# Patient Record
Sex: Female | Born: 1945 | Race: White | Hispanic: No | Marital: Married | Smoking: Never smoker
Health system: Southern US, Community
[De-identification: ages and names within clinical notes are randomized; demographics above are authoritative.]

## PROBLEM LIST (undated history)

## (undated) DIAGNOSIS — E039 Hypothyroidism, unspecified: Secondary | ICD-10-CM

## (undated) DIAGNOSIS — M797 Fibromyalgia: Secondary | ICD-10-CM

## (undated) DIAGNOSIS — G629 Polyneuropathy, unspecified: Secondary | ICD-10-CM

## (undated) DIAGNOSIS — I10 Essential (primary) hypertension: Secondary | ICD-10-CM

## (undated) DIAGNOSIS — K5190 Ulcerative colitis, unspecified, without complications: Secondary | ICD-10-CM

## (undated) HISTORY — PX: ABDOMINAL HYSTERECTOMY: SHX81

## (undated) HISTORY — PX: CHOLECYSTECTOMY: SHX55

## (undated) HISTORY — DX: Polyneuropathy, unspecified: G62.9

## (undated) HISTORY — DX: Hypothyroidism, unspecified: E03.9

## (undated) HISTORY — PX: TONSILLECTOMY: SUR1361

## (undated) HISTORY — PX: BACK SURGERY: SHX140

## (undated) HISTORY — PX: APPENDECTOMY: SHX54

---

## 2002-02-24 HISTORY — PX: COLONOSCOPY: SHX174

## 2006-08-25 HISTORY — PX: COLONOSCOPY: SHX174

## 2012-02-25 DIAGNOSIS — G629 Polyneuropathy, unspecified: Secondary | ICD-10-CM

## 2012-02-25 HISTORY — DX: Polyneuropathy, unspecified: G62.9

## 2013-02-24 HISTORY — PX: ESOPHAGOGASTRODUODENOSCOPY: SHX1529

## 2013-04-24 HISTORY — PX: FLEXIBLE SIGMOIDOSCOPY: SHX1649

## 2014-06-02 ENCOUNTER — Encounter: Payer: Self-pay | Admitting: Gastroenterology

## 2014-06-21 ENCOUNTER — Encounter: Payer: Self-pay | Admitting: Gastroenterology

## 2014-06-21 ENCOUNTER — Ambulatory Visit (INDEPENDENT_AMBULATORY_CARE_PROVIDER_SITE_OTHER): Payer: Medicare Other | Admitting: Gastroenterology

## 2014-06-21 VITALS — BP 148/85 | HR 63 | Temp 97.0°F | Ht 63.0 in | Wt 195.4 lb

## 2014-06-21 DIAGNOSIS — R197 Diarrhea, unspecified: Secondary | ICD-10-CM

## 2014-06-21 DIAGNOSIS — R112 Nausea with vomiting, unspecified: Secondary | ICD-10-CM | POA: Insufficient documentation

## 2014-06-21 DIAGNOSIS — R1319 Other dysphagia: Secondary | ICD-10-CM | POA: Insufficient documentation

## 2014-06-21 DIAGNOSIS — R945 Abnormal results of liver function studies: Secondary | ICD-10-CM

## 2014-06-21 DIAGNOSIS — R1032 Left lower quadrant pain: Secondary | ICD-10-CM | POA: Diagnosis not present

## 2014-06-21 DIAGNOSIS — R748 Abnormal levels of other serum enzymes: Secondary | ICD-10-CM

## 2014-06-21 DIAGNOSIS — R1310 Dysphagia, unspecified: Secondary | ICD-10-CM | POA: Insufficient documentation

## 2014-06-21 DIAGNOSIS — R1314 Dysphagia, pharyngoesophageal phase: Secondary | ICD-10-CM | POA: Diagnosis not present

## 2014-06-21 DIAGNOSIS — R7989 Other specified abnormal findings of blood chemistry: Secondary | ICD-10-CM

## 2014-06-21 NOTE — Progress Notes (Signed)
Primary Care Physician:  Terance HartPowell, Brook R, PA-C  Primary Gastroenterologist:  Jonette EvaSandi Fields, MD   Chief Complaint  Patient presents with  . Gastrophageal Reflux  . Diarrhea  . Abdominal Pain    HPI:  Bonnie Reynolds is a 69 y.o. female here at the request of her PCP for further evaluation of chronic abdominal pain, diarrhea, GERD.   Patient reports being evaluated multiple times by Dr. Nolen MuPanya and Dr. Virgilio BellingShifflett over the years. States she is not aware of any specific diagnoses to explain her symptoms. Believe she was checked for celiac disease. States she thinks she was on Crohn's medication previously. Reports having a sigmoidoscopy about a year ago by Dr. Samuella CotaPandya.  GERD  Reports chronic GERD for years. Initially presented with nausea vomiting felt to be GERD related. Complains of excessive belching. Only occasional heartburn. Avoids dairy products, soft drinks. She does consume tea with caffeine. Intermittent vomiting. Associated with dysphagia to solid foods. Has to wash her food down. No previous esophageal stretching to her knowledge. States in the past she tried a PPI years ago but she stopped it because it didn't seem to help any of her symptoms. Complains of nausea after just a few bites of food. Denies any weight loss however. Symptoms improved with a couple of ounces of wine per night. Used to take Aleve frequently for neuropathy but quit about a year ago. Only takes occasional ibuprofen for headache. No aspirin powders.  Recently try pantoprazole 20 mg daily but no improvement. No longer taking.  Abdominal pain Complains of lower abdominal cramping especially worse at nighttime. Not associated with a bowel movement. Occurring for years. Couple of ounces of wine seems to make it better. Does not appear to be associated with food. Mild to moderate severity.  Alternating bowel movements  May go a few days without a bowel movement, then pass hard stool followed by diarrhea. Then passes mucus  for a few days, malodorous. Some fecal incontinence with loose stools at times. Constipation and diarrhea about half-and-half. Utilizes "diarrhea pill" which then exacerbates constipation. Recently was given samples of Viberzi, took one and developed uncontrollable shaking and crying. BM may skip 2-3 days without but then pass hard ball but then to diarrhea. Then mucous for few days. Fish odor. Incontinence with it. Denies any melena. No recent rectal bleeding.    Current Outpatient Prescriptions  Medication Sig Dispense Refill  . levothyroxine (SYNTHROID, LEVOTHROID) 125 MCG tablet Take 125 mcg by mouth daily before breakfast.    . VITAMIN D, CHOLECALCIFEROL, PO Take by mouth daily.     No current facility-administered medications for this visit.    Allergies as of 06/21/2014 - Review Complete 06/21/2014  Allergen Reaction Noted  . Codeine Other (See Comments) 06/21/2014  . Prednisone Swelling 06/21/2014    Past Medical History  Diagnosis Date  . Hypothyroidism   . Neuropathy     Past Surgical History  Procedure Laterality Date  . Abdominal hysterectomy      cancer  . Cholecystectomy    . Appendectomy    . Tonsillectomy    . Back surgery      X 2, cervical and thoracic    Family History  Problem Relation Age of Onset  . Colitis Brother     1/2 brother  . Colon cancer Neg Hx   . Neuropathy Mother     deceased age 71  . CVA Father     deceased age 594    History   Social  History  . Marital Status: Unknown    Spouse Name: N/A  . Number of Children: 2  . Years of Education: N/A   Occupational History  . hair dresser    Social History Main Topics  . Smoking status: Never Smoker   . Smokeless tobacco: Not on file  . Alcohol Use: Not on file  . Drug Use: Not on file  . Sexual Activity: Not on file   Other Topics Concern  . Not on file   Social History Narrative  . No narrative on file      ROS:  General: Negative for anorexia, weight loss, fever,  chills, fatigue, weakness. Eyes: Negative for vision changes.  ENT: Negative for hoarseness, nasal congestion. CV: Negative for chest pain, angina, palpitations, dyspnea on exertion, peripheral edema.  Respiratory: Negative for dyspnea at rest, dyspnea on exertion, cough, sputum, wheezing. Episode of inability to move air, requiring ER visit with no subsequent findings. Felt like she was choking. Not associated with eating. GI: See history of present illness. GU:  Negative for dysuria, hematuria, urinary incontinence, urinary frequency, nocturnal urination.  MS: Negative for joint pain. Positive back pain.  Derm: Negative for rash or itching.  Neuro: Negative for weakness, abnormal sensation, seizure, frequent headaches, memory loss, confusion.  Psych: Negative for anxiety, depression, suicidal ideation, hallucinations.  Endo: Negative for unusual weight change.  Heme: Negative for bruising or bleeding. Allergy: Negative for rash or hives.    Physical Examination:  BP 148/85 mmHg  Pulse 63  Temp(Src) 97 F (36.1 C)  Ht  (1.6 m)  Wt 195 lb 6.4 oz (88.633 kg)  BMI 34.62 kg/m2   General: Well-nourished, well-developed in no acute distress.  Head: Normocephalic, atraumatic.   Eyes: Conjunctiva pink, no icterus. Mouth: Oropharyngeal mucosa moist and pink , no lesions erythema or exudate. Neck: Supple without thyromegaly, masses, or lymphadenopathy.  Lungs: Clear to auscultation bilaterally.  Heart: Regular rate and rhythm, no murmurs rubs or gallops.  Abdomen: Bowel sounds are normal, nontender, nondistended, no hepatosplenomegaly or masses, no abdominal bruits or    hernia , no rebound or guarding.   Rectal: Not performed Extremities: No lower extremity edema. No clubbing or deformities.  Neuro: Alert and oriented x 4 , grossly normal neurologically.  Skin: Warm and dry, no rash or jaundice.   Psych: Alert and cooperative, normal mood and affect.  Labs: Labs from December  2015, PCP  White blood cell count 4500, hemoglobin 11.5 normal, platelets 211,000, creatinine 0.75, calcium 8.9, albumin 4.1, total bilirubin 0.5, alkaline phosphatase 154 high, AST 38, ALT 36 high, TSH 0.429 low  Imaging Studies: No results found.

## 2014-06-21 NOTE — Patient Instructions (Signed)
1. Give me a couple of days to request and review your records. Further recommendations to follow.

## 2014-06-23 DIAGNOSIS — R7989 Other specified abnormal findings of blood chemistry: Secondary | ICD-10-CM | POA: Insufficient documentation

## 2014-06-23 DIAGNOSIS — R945 Abnormal results of liver function studies: Secondary | ICD-10-CM | POA: Insufficient documentation

## 2014-06-23 MED ORDER — DEXLANSOPRAZOLE 60 MG PO CPDR: 60.0000 mg | DELAYED_RELEASE_CAPSULE | Freq: Every day | ORAL | Status: DC

## 2014-06-23 NOTE — Progress Notes (Signed)
Reviewed outside medical records.  Colonoscopy, 09/15/07 by Dr. Dessie Comaoug Shiflett. Normal colon except for diverticulosis, 2 minute rectal polyps biopsied. Pathology revealed adenomatous and hyperplastic.  Colonoscopy, 02/2002 by Dr. Dessie Comaoug Shiflett.  Unremarkable.  EGD, 02/2013 by Dr. Samuella CotaPandya. Hiatal hernia (2 cm), erosions (mild chronic gastritis, negative for H. Pylori).

## 2014-06-23 NOTE — Progress Notes (Signed)
Please let the patient know that I received some records.  The last colonoscopy I have was 2009. Please ask if she had one since then. Her last EGD 02/2013.  She needs labs.  I would like for her to start Dexilant 60mg  daily before breakfast. If not covered by insurance she needs to let us know.  Further recommendations to follow.

## 2014-06-23 NOTE — Assessment & Plan Note (Signed)
Abnormal ALT, alkaline phosphatase on recent labs. Recheck at this time.

## 2014-06-23 NOTE — Assessment & Plan Note (Signed)
Chronic left lower quadrant abdominal pain, alternating constipation and diarrhea. Reviewed outside medical records. History of IBS. Last colonoscopy I could find was from 2009, adenomatous colon polyps at that time. We'll try to clarify further with the patient to make sure she's not had one in the interim. She should've had one around 2014 based on polyps.  Some of patient's constipation related to Imodium use. Recently tried one Viberzi but reported side effects and stopped it.   No clear indication she was checked for celiac disease, labs to be done.

## 2014-06-23 NOTE — Assessment & Plan Note (Signed)
69 year old female with multiple GI complaints, previously evaluated by Dr. Elder CyphersShiflett and Dr. Samuella [place]otaPandya in [place]. able to retrieve some records as outlined above. Patient reportedly has GERD although she has never really maintained PPI therapy long-term. EGD in January 2015 showed small hiatal hernia and mild gastritis. Negative for H pylori.  Patient reports solid food dysphagia. No prior esophageal dilation.  Given the fairly recent EGD, empirically try PPI therapy once daily. Prescription provided. If solid food dysphagia does not improve consider EGD with dilation at that point versus barium pill esophagram.

## 2014-06-26 NOTE — Progress Notes (Signed)
Tried to call pt. VM full. Mailing a letter to call. [facility] orders faxed [facility].

## 2014-06-27 NOTE — Progress Notes (Signed)
CC'ED TO PCP 

## 2014-06-30 ENCOUNTER — Telehealth: Payer: Self-pay

## 2014-06-30 NOTE — Telephone Encounter (Signed)
Pt's husband called because pt is at work. Pt had received a letter to call  She is at work and could not call. She had gotten the message about lab order and would like a copy so she can go near home to do the labsl I told him I could not discuss the info with him, but I would put the lab orders in the mail for her along with the other info.  Letter mailed to pt to include the lab orders.

## 2014-07-04 NOTE — Telephone Encounter (Signed)
Noted. Await information and labs from patient.

## 2014-07-19 ENCOUNTER — Telehealth: Payer: Self-pay

## 2014-07-19 NOTE — Telephone Encounter (Signed)
T/C from pt's husband. Pt is at work and unable to call at this time. She had OV with Tana CoastLeslie Lewis, PA on 06/21/2014 and was to do labs after MiloLeslie reviewed some records. I could not get in touch with pt and mailed her a letter and faxed lab orders to [facility]. See telephone note dated 06/30/2014, husband called and wanted me to mail the lab orders to her so [place] could [place]. She had not heard back and is still having some problems with abdominal pain, diarrhea and gets sick almost everytime that she eats.  She did not pick up the Dexilant. Husband said she asked the pharmacist what it was for and then she told pharmacist that she did not need it for that. I asked him about her last colonoscopy, ( [facility] and an EGD in 02/2013) He said that pt is sure she has had a TCS and an EGD since then, he [facility] and he will check on that. Forwarding to Tana CoastLeslie Lewis, PA for recommendations. Pt would like to come back in and see what can be done, but she really needs a Monday or Tuesday.

## 2014-07-20 NOTE — Telephone Encounter (Signed)
Difficult to manage patient since she is not able to call the office per husband. Taking information through the husband.   She did not take Dexilant which I recommended for GERD, dysphagia that she complained about at time of OV.  Labs reviewed, dated 07/10/2014. White blood cell count 4900, hemoglobin 11.3 normal, hematocrit 35.1, MCV 81, platelets 209,000, BUN 16, creatinine 0.81, total bilirubin 0.5, alkaline phosphatase 134 high, AST 38, ALT 39 slightly high, ferritin 13 low, TIBC high at 464, serum iron 76 iron saturation 16%, hepatitis C antibody negative, GGT 95 elevated, hepatitis B surface antigen negative.  1. Please request last sigmoidoscopy and any path from Dr. Samuella CotaPandya, patient reported done in the last couple of years.  2. Agree with need for her to come back in and reassess now that we have some of her records and up to date labs. I would like to make sure we have last sigmoidoscopy before her next visit.  3. Her labs indicate slightly elevated ALT, alkphos and we will be recommending abd u/s. 4. She has IDA on labs which we will address at OV.  5. If she can complete ifobt in the meantime that would be helpful.

## 2014-07-20 NOTE — Telephone Encounter (Signed)
Received the results and placed on Bonnie Reynolds's desk.

## 2014-07-20 NOTE — Telephone Encounter (Signed)
I have called the [facility] customer service @ [phone] and [facility] She said the labs were done on 07/10/2014. She is faxing the results to us.

## 2014-07-20 NOTE — Telephone Encounter (Signed)
I called and pt is at work. I gave her husband the information.  I am mailing the iFOBT for pt to do and return as soon as possible. He is aware we will try to get the remaining records and schedule pt for an OV per LL.  Routing to Darl PikesSusan to get the records and to schedule the OV appt.

## 2014-07-26 NOTE — Progress Notes (Signed)
REVIEWED-NO ADDITIONAL RECOMMENDATIONS. 

## 2014-07-26 NOTE — Telephone Encounter (Signed)
I requested records from Dr Samuella CotaPandya and Dr Elder CyphersShiflett back in April.  I requested again from both again this morning ASAP also requested them to send last sigmoidoscopy and path report if available

## 2014-07-26 NOTE — Telephone Encounter (Signed)
Reports put on Bonnie Reynolds's desk.

## 2014-07-27 NOTE — Telephone Encounter (Addendum)
More records received from Dr. Ofilia NeasPangya.  Flexible sigmoidoscopy up to 60 cm with biopsy on 04/29/2013. Sedated using propofol. Occasional diverticula of the sigmoid colon seen. There was edema, erythema, ulceration the mucosa proximal to 30 cm consistent with ischemic bowel disease. Biopsy results were not sent. No further attempts planned given we have attempted on numerous occasions now.  See previous request,   Patient needs OV to discuss next step since all of records now available, preferably with Dr. Darrick PennaFields due to complexity.

## 2014-07-27 NOTE — Progress Notes (Signed)
Flex sig 04/2013, Dr. Samuella CotaPandya with propofol: Edema, erythema, ulceration of the mucosa proximal to 30 cm consistent with ischemic bowel disease. Biopsies unavailable, requested multiple times.

## 2014-07-27 NOTE — Telephone Encounter (Signed)
Pt is aware. OV with Dr. Darrick PennaFields for 09/06/2014 at 10:30 AM, had to do that far out due to Dr. Darrick PennaFields schedule and due to the pt's request days.   She has not received the iFOBT in the mail yet. Would have gone out on 07/20/2014.  She will call on Mon 07/31/2014 if she does not receive it and I will mail another one.

## 2014-08-07 ENCOUNTER — Ambulatory Visit (INDEPENDENT_AMBULATORY_CARE_PROVIDER_SITE_OTHER): Payer: Medicare Other

## 2014-08-07 DIAGNOSIS — R1032 Left lower quadrant pain: Secondary | ICD-10-CM

## 2014-08-07 LAB — IFOBT (OCCULT BLOOD): IMMUNOLOGICAL FECAL OCCULT BLOOD TEST: POSITIVE

## 2014-08-08 NOTE — Progress Notes (Signed)
Quick Note:  Heme + stool. IDA. Patient has upcoming appt that she needs to keep with [facility]. Will need colonoscopy at a minimum. ______

## 2014-08-17 ENCOUNTER — Telehealth: Payer: Self-pay | Admitting: Gastroenterology

## 2014-08-17 NOTE — Telephone Encounter (Signed)
Reviewed additional labs from PCP dated 08/01/14  White blood cell count 6000, hemoglobin 12.1, platelets 241,000, glucose 84, creatinine 0.85, BUN 18, total bilirubin 0.8, alkaline phosphatase 136, AST 46, ALT 37, albumen 4.3, TSH 0.282  Patient has upcoming appointment to discuss multiple GI issues with Dr. Darrick Penna. History of IDA and abnormal LFTs, heme positive.

## 2014-08-17 NOTE — Telephone Encounter (Signed)
REVIEWED-NO ADDITIONAL RECOMMENDATIONS. 

## 2014-08-21 NOTE — Telephone Encounter (Signed)
Noted  

## 2014-09-01 ENCOUNTER — Encounter: Payer: Self-pay | Admitting: Gastroenterology

## 2014-09-06 ENCOUNTER — Encounter: Payer: Self-pay | Admitting: Gastroenterology

## 2014-09-06 ENCOUNTER — Other Ambulatory Visit: Payer: Self-pay

## 2014-09-06 ENCOUNTER — Ambulatory Visit (INDEPENDENT_AMBULATORY_CARE_PROVIDER_SITE_OTHER): Payer: Medicare Other | Admitting: Gastroenterology

## 2014-09-06 VITALS — BP 157/76 | HR 62 | Temp 97.9°F | Ht 63.0 in | Wt 193.8 lb

## 2014-09-06 DIAGNOSIS — R1319 Other dysphagia: Secondary | ICD-10-CM

## 2014-09-06 DIAGNOSIS — R1314 Dysphagia, pharyngoesophageal phase: Secondary | ICD-10-CM

## 2014-09-06 DIAGNOSIS — R197 Diarrhea, unspecified: Secondary | ICD-10-CM | POA: Diagnosis not present

## 2014-09-06 DIAGNOSIS — R1310 Dysphagia, unspecified: Secondary | ICD-10-CM

## 2014-09-06 DIAGNOSIS — K551 Chronic vascular disorders of intestine: Secondary | ICD-10-CM

## 2014-09-06 MED ORDER — PEG-KCL-NACL-NASULF-NA ASC-C 100 G PO SOLR: 1.0000 | Freq: Once | ORAL | Status: DC

## 2014-09-06 NOTE — Progress Notes (Signed)
 Subjective:    Patient ID: Bonnie Reynolds, female    DOB: 08/27/1945, 68 y.o.   MRN: [id number]  [name], Brook R, PA-C  HPI DIARRHEA FOR PAST 3 YEARS. USED TO HAVE FOR 3-4 DAYS THEN MUCOUS THEN WATERY DIARRHEA. MEDS TRIED: ???.  2 MOS AGO LARGE BLACK WATERY STOOLS. LAST WED: LARGE WATERY BOWEL MOVEMENTS, ASSOCIATED WITH PROFUSE SWEATING AND FELT WEAK. HAS SEEN: 2 OTHER GI DOCS IN PAST FOR SAME SX(SHIFLETT, PANDYA). TRIGGERS: LETTUCE SOMETIMES. EATS SWEETS CAUSES SEVER NAUSEA. ACID REFLUX ALL THE TIME. IT BURNS AND THROWING UP BITTER STUFF. WAS 205 LBS WITHIN PAST YEAR. PT STATES SHE'S LOST 10 LBS IN PAST 2 MOS BUT WEIGHED 195 LBS IN APR 2016. MEDS CHANGED YESTERDAY TO 125 MCG. HAS HOT FLASHES. NOR TRAVEL. ABX FOR UTI OFF AND ON FOR PAST 4 MOS. CITY WATER. DRINKS BOTTLED WATER. iFOBT POS JUN 2016. NAUSEA EVERY DAY. LAST NL FORMED STOOL: RARE. SOB: CAN'T WALK A FLIGHT OF STEPS BUT THINKS IT'S BECAUSE SHE'S OLD AND OVERWEIGHT. ABDOMEN (LOWER, CRAMPS) HURTS ALL THE TIME AND LAYS DOWN. RECTAL BLEEDING OFF AND ON(2 BLOW OUT THINGS AND CAN BE SPOTS). LAST ENDOSCOPY WITH [facility]. 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 FEELS LIKE EVERYTHING STICKS IN HER CHEST. MED TRIED FOR HEARTBURN: NOTHING. MILK MAY CAUSE LOOSE STOOLS. EATS AND THEN HAS ABDOMINAL PAIN AFTER 3 BITES.  PT DENIES FEVER, CHILLS, HEMATOCHEZIA, vomiting, melena, CHEST PAIN, CHANGE IN BOWEL IN HABITS, OR  problems swallowing.  Past Medical History  Diagnosis Date  . Hypothyroidism   . Neuropathy    Past Surgical History  Procedure Laterality Date  . Abdominal hysterectomy      cancer  . Cholecystectomy    . Appendectomy    . Tonsillectomy    . Back surgery      X 2, cervical and thoracic  . Flexible sigmoidoscopy  04/2013    Dr. Pandya, Propofol: exam to 60cm, abnormal mucosa with edema, erythema and ulceration proximal to 30 cm consistent with ischemic bowel disease. Biopsies taken, we didn't ever receive those records  however. Sigmoid diverticulosis also seen.  . Colonoscopy  08/2006    Dr. Doug Shiflett: Normal colon except for diverticulosis and 2 rectal polyps, one was tubular adenoma.  . Colonoscopy  2004    Dr. Douglas Shiflett: unremarkable  . Esophagogastroduodenoscopy  02/2013    Dr. Pandya: small hiatal hernia and mild gastritis. Negative for H pylori.   Allergies  Allergen Reactions  . Codeine Other (See Comments)    Makes he nervous  . Prednisone Swelling   Current Outpatient Prescriptions  Medication Sig Dispense Refill  . levothyroxine (SYNTHROID, LEVOTHROID) 125 MCG tablet Take 125 mcg by mouth daily before breakfast.    . VITAMIN D, CHOLECALCIFEROL, PO Take by mouth daily.    .    5   Family History  Problem Relation Age of Onset  . Colitis Brother     1/2 brother  . Colon cancer Neg Hx   . Colon polyps Neg Hx   . Neuropathy Mother     deceased age 92  . CVA Father     deceased age 94     History   Social History  . Marital Status: Unknown    Spouse Name: N/A  . Number of Children: 2  . Years of Education: N/A   Occupational History  . hair dresser    Social History Main Topics  . Smoking status: Never Smoker   .   Smokeless tobacco: Not on file  . Alcohol Use: Not on file  . Drug Use: Not on file  . Sexual Activity: Not on file    Review of Systems CRAMPING ALL THE TIME IN LEGS/FEET. PER HPI OTHERWISE ALL SYSTEMS ARE NEGATIVE.     Objective:   Physical Exam  Constitutional: She is oriented to person, place, and time. She appears well-developed and well-nourished. No distress.  HENT:  Head: Normocephalic and atraumatic.  Mouth/Throat: Oropharynx is clear and moist. No oropharyngeal exudate.  Eyes: Pupils are equal, round, and reactive to light. No scleral icterus.  Neck: Normal range of motion. Neck supple.  Cardiovascular: Normal rate, regular rhythm and normal heart sounds.   Pulmonary/Chest: Effort normal and breath sounds normal. No respiratory  distress.  Abdominal: Soft. Bowel sounds are normal. She exhibits no distension. There is tenderness. There is no rebound and no guarding.  MILD LLQ TTP   Musculoskeletal: She exhibits no edema.  Lymphadenopathy:    She has no cervical adenopathy.  Neurological: She is alert and oriented to person, place, and time.  NO FOCAL DEFICITS   Psychiatric: She has a normal mood and affect.  Vitals reviewed.         Assessment & Plan:

## 2014-09-06 NOTE — Assessment & Plan Note (Signed)
SYMPTOMS NOT IDEALLY CONTROLLED. MAY BE DUE TO STRICTURE OR GERD UNCONTROLLED.  EGD/DIL IN 2 WEEKS WITH [facility] DUE TO FAILED CONSCIOUS SEDATION

## 2014-09-06 NOTE — Patient Instructions (Addendum)
FOLLOW A SOFT MECHANICAL/LOW FAT DIET. MEATS SHOULD BE BAKED, BROILED, OR BOILED.  MEATS SHOULD BE CHOPPED OR GROUND ONLY UNTIL YOUR UPPER ENDOSCOPY. DO NOT EAT CHUNKS OF ANYTHING. SEE INFO BELOW.  ADD LACTASE 3 PILLS WITH MEALS.  ADD OMEPRAZOLE SAMPLES.  TAKE 30 MINUTES PRIOR TO YOUR FIRST MEAL.  COMPLETE CT AND ENDOSCOPY.  FOLLOW UP IN 4 MOS.   Low-Fat Diet BREADS, CEREALS, PASTA, RICE, DRIED PEAS, AND BEANS These products are high in carbohydrates and most are low in fat. Therefore, they can be increased in the diet as substitutes for fatty foods. They too, however, contain calories and should not be eaten in excess. Cereals can be eaten for snacks as well as for breakfast.   FRUITS AND VEGETABLES It is good to eat fruits and vegetables. Besides being sources of fiber, both are rich in vitamins and some minerals. They help you get the daily allowances of these nutrients. Fruits and vegetables can be used for snacks and desserts.  MEATS Limit lean meat, chicken, [place], and fish to no more than 6 ounces per day. Beef, Pork, and Lamb Use lean cuts of beef, pork, and lamb. Lean cuts include:  Extra-lean ground beef.  Arm roast.  Sirloin tip.  Center-cut ham.  Round steak.  Loin chops.  Rump roast.  Tenderloin.  Trim all fat off the outside of meats before cooking. It is not necessary to severely decrease the intake of red meat, but lean choices should be made. Lean meat is rich in protein and contains a highly absorbable form of iron. Premenopausal women, in particular, should avoid reducing lean red meat because this could increase the risk for low red blood cells (iron-deficiency anemia).  Chicken and [place] These are good sources of protein. The fat of poultry can be reduced by removing the skin and underlying fat layers before cooking. Chicken and [place] can be substituted for lean red meat in the diet. Poultry should not be fried or covered with high-fat sauces. Fish and  Shellfish Fish is a good source of protein. Shellfish contain cholesterol, but they usually are low in saturated fatty acids. The preparation of fish is important. Like chicken and [place], they should not be fried or covered with high-fat sauces. EGGS Egg whites contain no fat or cholesterol. They can be eaten often. Try 1 to 2 egg whites instead of whole eggs in recipes or use egg substitutes that do not contain yolk. MILK AND DAIRY PRODUCTS Use skim or 1% milk instead of 2% or whole milk. Decrease whole milk, natural, and processed cheeses. Use nonfat or low-fat (2%) cottage cheese or low-fat cheeses made from vegetable oils. Choose nonfat or low-fat (1 to 2%) yogurt. Experiment with evaporated skim milk in recipes that call for heavy cream. Substitute low-fat yogurt or low-fat cottage cheese for sour cream in dips and salad dressings. Have at least 2 servings of low-fat dairy products, such as 2 glasses of skim (or 1%) milk each day to help get your daily calcium intake. FATS AND OILS Reduce the total intake of fats, especially saturated fat. Butterfat, lard, and beef fats are high in saturated fat and cholesterol. These should be avoided as much as possible. Vegetable fats do not contain cholesterol, but certain vegetable fats, such as coconut oil, palm oil, and palm kernel oil are very high in saturated fats. These should be limited. These fats are often used in bakery goods, processed foods, popcorn, oils, and nondairy creamers. Vegetable shortenings and some peanut  butters contain hydrogenated oils, which are also saturated fats. Read the labels on these foods and check for saturated vegetable oils. Unsaturated vegetable oils and fats do not raise blood cholesterol. However, they should be limited because they are fats and are high in calories. Total fat should still be limited to 30% of your daily caloric intake. Desirable liquid vegetable oils are corn oil, cottonseed oil, olive oil, canola oil,  safflower oil, soybean oil, and sunflower oil. Peanut oil is not as good, but small amounts are acceptable. Buy a heart-healthy tub margarine that has no partially hydrogenated oils in the ingredients. Mayonnaise and salad dressings often are made from unsaturated fats, but they should also be limited because of their high calorie and fat content. Seeds, nuts, peanut butter, olives, and avocados are high in fat, but the fat is mainly the unsaturated type. These foods should be limited mainly to avoid excess calories and fat. OTHER EATING TIPS Snacks  Most sweets should be limited as snacks. They tend to be rich in calories and fats, and their caloric content outweighs their nutritional value. Some good choices in snacks are graham crackers, melba toast, soda crackers, bagels (no egg), English muffins, fruits, and vegetables. These snacks are preferable to snack crackers, [place] fries, TORTILLA CHIPS, and POTATO chips. Popcorn should be air-popped or cooked in small amounts of liquid vegetable oil. Desserts Eat fruit, low-fat yogurt, and fruit ices instead of pastries, cake, and cookies. Sherbet, angel food cake, gelatin dessert, frozen low-fat yogurt, or other frozen products that do not contain saturated fat (pure fruit juice bars, frozen ice pops) are also acceptable.  COOKING METHODS Choose those methods that use little or no fat. They include: Poaching.  Braising.  Steaming.  Grilling.  Baking.  Stir-frying.  Broiling.  Microwaving.  Foods can be cooked in a nonstick pan without added fat, or use a nonfat cooking spray in regular cookware. Limit fried foods and avoid frying in saturated fat. Add moisture to lean meats by using water, broth, cooking wines, and other nonfat or low-fat sauces along with the cooking methods mentioned above. Soups and stews should be chilled after cooking. The fat that forms on top after a few hours in the refrigerator should be skimmed off. When preparing meals,  avoid using excess salt. Salt can contribute to raising blood pressure in some people.  EATING AWAY FROM HOME Order entres, potatoes, and vegetables without sauces or butter. When meat exceeds the size of a deck of cards (3 to 4 ounces), the rest can be taken home for another meal. Choose vegetable or fruit salads and ask for low-calorie salad dressings to be served on the side. Use dressings sparingly. Limit high-fat toppings, such as bacon, crumbled eggs, cheese, sunflower seeds, and olives. Ask for heart-healthy tub margarine instead of butter.

## 2014-09-06 NOTE — Assessment & Plan Note (Addendum)
ASSOCIATED WITH LOWER ABDOMINAL PAIN AND RECTAL BLEEDING. 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 NEED EGD/DIL FOR STRETCHING AND CELIAC SPRUE. ILEOCOLONOSCOPY FOR RECTAL BLEEDING AND DIARRHEA. CONSIDER VIBERZI 75 MG BID OF NO MICROSCOPIC COLITIS OR IBD. FOLLOW UP IN 4 MOS.

## 2014-09-11 NOTE — Progress Notes (Signed)
CC'ED TO PCP 

## 2014-09-12 ENCOUNTER — Ambulatory Visit (HOSPITAL_COMMUNITY)
Admission: RE | Admit: 2014-09-12 | Discharge: 2014-09-12 | Disposition: A | Discharge status: Home / Self Care | Payer: Medicare Other | Source: Ambulatory Visit | Attending: Gastroenterology | Admitting: Gastroenterology

## 2014-09-12 ENCOUNTER — Other Ambulatory Visit: Payer: Self-pay

## 2014-09-12 DIAGNOSIS — Z9049 Acquired absence of other specified parts of digestive tract: Secondary | ICD-10-CM | POA: Diagnosis not present

## 2014-09-12 DIAGNOSIS — K551 Chronic vascular disorders of intestine: Secondary | ICD-10-CM

## 2014-09-12 DIAGNOSIS — R197 Diarrhea, unspecified: Secondary | ICD-10-CM | POA: Diagnosis not present

## 2014-09-12 DIAGNOSIS — Z90710 Acquired absence of both cervix and uterus: Secondary | ICD-10-CM | POA: Insufficient documentation

## 2014-09-12 DIAGNOSIS — R1030 Lower abdominal pain, unspecified: Secondary | ICD-10-CM | POA: Diagnosis not present

## 2014-09-12 DIAGNOSIS — K5730 Diverticulosis of large intestine without perforation or abscess without bleeding: Secondary | ICD-10-CM | POA: Diagnosis not present

## 2014-09-12 LAB — POCT I-STAT CREATININE: CREATININE: 1 mg/dL (ref 0.44–1.00)

## 2014-09-12 MED ORDER — IOHEXOL 350 MG/ML SOLN: 100.0000 mL | Freq: Once | INTRAVENOUS | Status: AC | PRN

## 2014-09-12 MED ORDER — PEG 3350-KCL-NA BICARB-NACL 420 G PO SOLR: 4000.0000 mL | ORAL | Status: DC

## 2014-09-13 ENCOUNTER — Telehealth: Payer: Self-pay | Admitting: Gastroenterology

## 2014-09-13 ENCOUNTER — Telehealth: Payer: Self-pay

## 2014-09-13 NOTE — Telephone Encounter (Signed)
PLEASE CALL PT. SHE SHOULD HOLD DEXILANT OF SHE THINKS IT IS CAUSING HER DIARRHEA. IF SHE IS HAVING WATERY STOOL SHE NEEDS TO SUBMIT STOOLF OR AN EVALUATION (A C DIFF PCR AND GIARDIA Ag) SHE MAY USE IMODIUM 1 OR 2 A DAY TO BETTER CONTROL HER DIARRHEA.  HER CT SHOWS NO BLOCKAGE OF THE VESSELS IN HER ABDOMEN. 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   FOLLOW A SOFT MECHANICAL/LOW FAT DIET. MEATS SHOULD BE BAKED, BROILED, OR BOILED. MEATS SHOULD BE CHOPPED OR GROUND ONLY UNTIL YOUR UPPER ENDOSCOPY. DO NOT EAT CHUNKS OF ANYTHING.   ADD LACTASE 3 PILLS WITH MEALS.  ADD OMEPRAZOLE SAMPLES. TAKE 30 MINUTES PRIOR TO YOUR FIRST MEAL.  COMPLETE THE ENDOSCOPY.  FOLLOW UP IN NOV 2016.

## 2014-09-13 NOTE — Telephone Encounter (Signed)
SEE TC JUL 20 SLF

## 2014-09-13 NOTE — Telephone Encounter (Signed)
Pt called and states that she is having diarrhea today and that when she got to work yesterday the began having abd pain and cramping. Denies vomiting or diarrhea. States that she broke out in a a cold sweat. States she thinks it is the dexilant. Pt states she is weak and isn't sure what is going on. Please advise

## 2014-09-14 ENCOUNTER — Other Ambulatory Visit: Payer: Self-pay

## 2014-09-14 DIAGNOSIS — R197 Diarrhea, unspecified: Secondary | ICD-10-CM

## 2014-09-14 NOTE — Telephone Encounter (Signed)
I called and informed pt. Stool containers and orders at front for pick up.

## 2014-09-21 ENCOUNTER — Other Ambulatory Visit (HOSPITAL_COMMUNITY): Payer: 59

## 2014-09-21 NOTE — Patient Instructions (Signed)
Bonnie Reynolds  09/21/2014     @PREFPERIOPPHARMACY @   Your procedure is scheduled on 09/26/2014.  Report to [facility] at 8:00 A.M.  Call this number if you have problems the morning of surgery:  [phone]   Remember:  Do not eat food or drink liquids after midnight.  Take these medicines the morning of surgery with A SIP OF WATER Dexilant, Synthroid, Lisinopril   Do not wear jewelry, make-up or nail polish.  Do not wear lotions, powders, or perfumes.  You may wear deodorant.  Do not shave 48 hours prior to surgery.  Men may shave face and neck.  Do not bring valuables to the hospital.  [facility] is not responsible for any belongings or valuables.  Contacts, dentures or bridgework may not be worn into surgery.  Leave your suitcase in the car.  After surgery it may be brought to your room.  For patients admitted to the hospital, discharge time will be determined by your treatment team.  Patients discharged the day of surgery will not be allowed to drive home.    Please read over the following fact sheets that you were given. Surgical Site Infection Prevention and Anesthesia Post-op Instructions      PATIENT INSTRUCTIONS POST-ANESTHESIA  IMMEDIATELY FOLLOWING SURGERY:  Do not drive or operate machinery for the first twenty four hours after surgery.  Do not make any important decisions for twenty four hours after surgery or while taking narcotic pain medications or sedatives.  If you develop intractable nausea and vomiting or a severe headache please notify your doctor immediately.  FOLLOW-UP:  Please make an appointment with your surgeon as instructed. You do not need to follow up with anesthesia unless specifically instructed to do so.  WOUND CARE INSTRUCTIONS (if applicable):  Keep a dry clean dressing on the anesthesia/puncture wound site if there is drainage.  Once the wound has quit draining you may leave it open to air.  Generally you should leave the bandage intact for  twenty four hours unless there is drainage.  If the epidural site drains for more than 36-48 hours please call the anesthesia department.  QUESTIONS?:  Please feel free to call your physician or the hospital operator if you have any questions, and they will be happy to assist you.      Esophageal Dilatation The esophagus is the long, narrow tube which carries food and liquid from the mouth to the stomach. Esophageal dilatation is the technique used to stretch a blocked or narrowed portion of the esophagus. This procedure is used when a part of the esophagus has become so narrow that it becomes difficult, painful or even impossible to swallow. This is generally an uncomplicated form of treatment. When this is not successful, chest surgery may be required. This is a much more extensive form of treatment with a longer recovery time. CAUSES  Some of the more common causes of blockage or strictures of the esophagus are:  Narrowing from longstanding inflammation (soreness and redness) of the lower esophagus. This comes from the constant exposure of the lower esophagus to the acid which bubbles up from the stomach. Over time this causes scarring and narrowing of the lower esophagus.  Hiatal hernia in which a small part of the stomach bulges (herniates) up through the diaphragm. This can cause a gradual narrowing of the end of the esophagus.  Schatzki ring is a narrow ring of benign (non-cancerous) fibrous tissue which constricts the lower esophagus. The reason for this  is not known.  Scleroderma is a connective tissue disorder that affects the esophagus and makes swallowing difficult.  Achalasia is an absence of nerves to the lower esophagus and to the esophageal sphincter. This is the circular muscle between the stomach and esophagus that relaxes to allow food into the stomach. After swallowing, it contracts to keep food in the stomach. This absence of nerves may be congenital (present since birth). This  can cause irregular spasms of the lower esophageal muscle. This spasm does not open up to allow food and fluid through. The result is a persistent blockage with subsequent slow trickling of the esophageal contents into the stomach.  Strictures may develop from swallowing materials which damage the esophagus. Some examples are strong acids or alkalis such as lye.  Growths such as benign (non-cancerous) and malignant (cancerous) tumors can block the esophagus.  Hereditary (present since birth) causes. DIAGNOSIS  Your caregiver often suspects this problem by taking a medical history. They will also do a physical exam. They can then prove their suspicions using X-rays and endoscopy. Endoscopy is an exam in which a tube like a small, flexible telescope is used to look at your esophagus.  TREATMENT There are different stretching (dilating) techniques that can be used. Simple bougie dilatation may be done in the office. This usually takes only a couple minutes. A numbing (anesthetic) spray of the throat is used. Endoscopy, when done, is done in an endoscopy suite under mild sedation. When fluoroscopy is used, the procedure is performed in X-ray. Other techniques require a little longer time. Recovery is usually quick. There is no waiting time to begin eating and drinking to test success of the treatment. Following are some of the methods used. Narrowing of the esophagus is treated by making it bigger. Commonly this is a mechanical problem which can be treated with stretching. This can be done in different ways. Your caregiver will discuss these with you. Some of the means used are:  A series of graduated (increasing thickness) flexible dilators can be used. These are weighted tubes passed through the esophagus into the stomach. The tubes used become progressively larger until the desired stretched size is reached. Graduated dilators are a simple and quick way of opening the esophagus. No visualization is  required.  Another method is the use of endoscopy to place a flexible wire across the stricture. The endoscope is removed and the wire left in place. A dilator with a hole through it from end to end is guided down the esophagus and across the stricture. One or more of these dilators are passed over the wire. At the end of the exam, the wire is removed. This type of treatment may be performed in the X-ray department under fluoroscopy. An advantage of this procedure is the examiner is visualizing the end opening in the esophagus.  Stretching of the esophagus may be done using balloons. Deflated balloons are placed through the endoscope and across the stricture. This type of balloon dilatation is often done at the time of endoscopy or fluoroscopy. Flexible endoscopy allows the examiner to directly view the stricture. A balloon is inserted in the deflated form into the area of narrowing. It is then inflated with air to a certain pressure that is preset for a given circumference. When inflated, it becomes sausage shaped, stretched, and makes the stricture larger.  Achalasia requires a longer, larger balloon-type dilator. This is frequently done under X-ray control. In this situation, the spastic muscle fibers in the lower  esophagus are stretched. All of the above procedures make the passage of food and water into the stomach easier. They also make it easier for stomach contents to reflux back into the esophagus. Special medications may be used following the procedure to help prevent further stricturing. Proton-pump inhibitor medications are good at decreasing the amount of acid in the stomach juice. When stomach juice refluxes into the esophagus, the juice is no longer as acidic and is less likely to burn or scar the esophagus. RISKS AND COMPLICATIONS Esophageal dilatation is usually performed effectively and without problems. Some complications that can occur are:  A small amount of bleeding almost always  happens where the stretching takes place. If this is too excessive it may require more aggressive treatment.  An uncommon complication is perforation (making a hole) of the esophagus. The esophagus is thin. It is easy to make a hole in it. If this happens, an operation may be necessary to repair this.  A small, undetected perforation could lead to an infection in the chest. This can be very serious. HOME CARE INSTRUCTIONS   If you received sedation for your procedure, do not drive, make important decisions, or perform any activities requiring your full coordination. Do not drink alcohol, take sedatives, or use any mind altering chemicals unless instructed by your caregiver.  You may use throat lozenges or warm salt water gargles if you have throat discomfort.  You can begin eating and drinking normally on return home unless instructed otherwise. Do not purposely try to force large chunks of food down to test the benefits of your procedure.  Mild discomfort can be eased with sips of ice water.  Medications for discomfort may or may not be needed. SEEK IMMEDIATE MEDICAL CARE IF:   You begin vomiting up blood.  You develop black, tarry stools.  You develop chills or an unexplained temperature of over 101F (38.3C)  You develop chest or abdominal pain.  You develop shortness of breath, or feel light-headed or faint.  Your swallowing is becoming more painful, difficult, or you are unable to swallow. MAKE SURE YOU:   Understand these instructions.  Will watch your condition.  Will get help right away if you are not doing well or get worse. Document Released: 04/03/2005 Document Revised: 06/27/2013 Document Reviewed: 05/21/2005 [facility] Patient Information 2015 [place], [place]. This information is not intended to replace advice given to you by your health care provider. Make sure you discuss any questions you have with your health care  provider. Esophagogastroduodenoscopy Esophagogastroduodenoscopy (EGD) is a procedure to examine the lining of the esophagus, stomach, and first part of the small intestine (duodenum). A long, flexible, lighted tube with a camera attached (endoscope) is inserted down the throat to view these organs. This procedure is done to detect problems or abnormalities, such as inflammation, bleeding, ulcers, or growths, in order to treat them. The procedure lasts about 5-20 minutes. It is usually an outpatient procedure, but it may need to be performed in emergency cases in the hospital. LET YOUR CAREGIVER KNOW ABOUT:   Allergies to food or medicine.  All medicines you are taking, including vitamins, herbs, eyedrops, and over-the-counter medicines and creams.  Use of steroids (by mouth or creams).  Previous problems you or members of your family have had with the use of anesthetics.  Any blood disorders you have.  Previous surgeries you have had.  Other health problems you have.  Possibility of pregnancy, if this applies. RISKS AND COMPLICATIONS  Generally, EGD is  a safe procedure. However, as with any procedure, complications can occur. Possible complications include:  Infection.  Bleeding.  Tearing (perforation) of the esophagus, stomach, or duodenum.  Difficulty breathing or not being able to breath.  Excessive sweating.  Spasms of the larynx.  Slowed heartbeat.  Low blood pressure. BEFORE THE PROCEDURE  Do not eat or drink anything for 6-8 hours before the procedure or as directed by your caregiver.  Ask your caregiver about changing or stopping your regular medicines.  If you wear dentures, be prepared to remove them before the procedure.  Arrange for someone to drive you home after the procedure. PROCEDURE   A vein will be accessed to give medicines and fluids. A medicine to relax you (sedative) and a pain reliever will be given through that access into the vein.  A  numbing medicine (local anesthetic) may be sprayed on your throat for comfort and to stop you from gagging or coughing.  A mouth guard may be placed in your mouth to protect your teeth and to keep you from biting on the endoscope.  You will be asked to lie on your left side.  The endoscope is inserted down your throat and into the esophagus, stomach, and duodenum.  Air is put through the endoscope to allow your caregiver to view the lining of your esophagus clearly.  The esophagus, stomach, and duodenum is then examined. During the exam, your caregiver may:  Remove tissue to be examined under a microscope (biopsy) for inflammation, infection, or other medical problems.  Remove growths.  Remove objects (foreign bodies) that are stuck.  Treat any bleeding with medicines or other devices that stop tissues from bleeding (hot cautery, clipping devices).  Widen (dilate) or stretch narrowed areas of the esophagus and stomach.  The endoscope will then be withdrawn. AFTER THE PROCEDURE  You will be taken to a recovery area to be monitored. You will be able to go home once you are stable and alert.  Do not eat or drink anything until the local anesthetic and numbing medicines have worn off. You may choke.  It is normal to feel bloated, have pain with swallowing, or have a sore throat for a short time. This will wear off.  Your caregiver should be able to discuss his or her findings with you. It will take longer to discuss the test results if any biopsies were taken. Document Released: 06/13/2004 Document Revised: 06/27/2013 Document Reviewed: 01/14/2012 [facility] Patient Information 2015 [place], [place]. This information is not intended to replace advice given to you by your health care provider. Make sure you discuss any questions you have with your health care provider. Colonoscopy A colonoscopy is an exam to look at the entire large intestine (colon). This exam can help find problems such  as tumors, polyps, inflammation, and areas of bleeding. The exam takes about 1 hour.  LET [facility] CARE PROVIDER KNOW ABOUT:   Any allergies you have.  All medicines you are taking, including vitamins, herbs, eye drops, creams, and over-the-counter medicines.  Previous problems you or members of your family have had with the use of anesthetics.  Any blood disorders you have.  Previous surgeries you have had.  Medical conditions you have. RISKS AND COMPLICATIONS  Generally, this is a safe procedure. However, as with any procedure, complications can occur. Possible complications include:  Bleeding.  Tearing or rupture of the colon wall.  Reaction to medicines given during the exam.  Infection (rare). BEFORE THE PROCEDURE  Ask your health care provider about changing or stopping your regular medicines.  You may be prescribed an oral bowel prep. This involves drinking a large amount of medicated liquid, starting the day before your procedure. The liquid will cause you to have multiple loose stools until your stool is almost clear or light green. This cleans out your colon in preparation for the procedure.  Do not eat or drink anything else once you have started the bowel prep, unless your health care provider tells you it is safe to do so.  Arrange for someone to drive you home after the procedure. PROCEDURE   You will be given medicine to help you relax (sedative).  You will lie on your side with your knees bent.  A long, flexible tube with a light and camera on the end (colonoscope) will be inserted through the rectum and into the colon. The camera sends video back to a computer screen as it moves through the colon. The colonoscope also releases carbon dioxide gas to inflate the colon. This helps your health care provider see the area better.  During the exam, your health care provider may take a small tissue sample (biopsy) to be examined under a microscope if any  abnormalities are found.  The exam is finished when the entire colon has been viewed. AFTER THE PROCEDURE   Do not drive for 24 hours after the exam.  You may have a small amount of blood in your stool.  You may pass moderate amounts of gas and have mild abdominal cramping or bloating. This is caused by the gas used to inflate your colon during the exam.  Ask when your test results will be ready and how you will get your results. Make sure you get your test results. Document Released: 02/08/2000 Document Revised: 12/01/2012 Document Reviewed: 10/18/2012 [facility] Patient Information 2015 [place], [place]. This information is not intended to replace advice given to you by your health care provider. Make sure you discuss any questions you have with your health care provider.

## 2014-09-25 ENCOUNTER — Encounter (HOSPITAL_COMMUNITY): Payer: Self-pay

## 2014-09-25 ENCOUNTER — Encounter (HOSPITAL_COMMUNITY)
Admission: RE | Admit: 2014-09-25 | Discharge: 2014-09-25 | Disposition: A | Discharge status: Home / Self Care | Payer: Medicare Other | Source: Ambulatory Visit | Attending: Gastroenterology | Admitting: Gastroenterology

## 2014-09-25 ENCOUNTER — Other Ambulatory Visit: Payer: Self-pay

## 2014-09-25 DIAGNOSIS — E039 Hypothyroidism, unspecified: Secondary | ICD-10-CM | POA: Diagnosis not present

## 2014-09-25 DIAGNOSIS — R1310 Dysphagia, unspecified: Secondary | ICD-10-CM | POA: Diagnosis not present

## 2014-09-25 DIAGNOSIS — R109 Unspecified abdominal pain: Secondary | ICD-10-CM | POA: Diagnosis not present

## 2014-09-25 DIAGNOSIS — Z79899 Other long term (current) drug therapy: Secondary | ICD-10-CM | POA: Diagnosis not present

## 2014-09-25 DIAGNOSIS — Z01810 Encounter for preprocedural cardiovascular examination: Secondary | ICD-10-CM | POA: Diagnosis not present

## 2014-09-25 DIAGNOSIS — R110 Nausea: Secondary | ICD-10-CM | POA: Diagnosis not present

## 2014-09-25 DIAGNOSIS — R197 Diarrhea, unspecified: Secondary | ICD-10-CM | POA: Diagnosis not present

## 2014-09-25 DIAGNOSIS — Z01812 Encounter for preprocedural laboratory examination: Secondary | ICD-10-CM | POA: Diagnosis not present

## 2014-09-25 DIAGNOSIS — K219 Gastro-esophageal reflux disease without esophagitis: Secondary | ICD-10-CM | POA: Diagnosis not present

## 2014-09-25 DIAGNOSIS — K2950 Unspecified chronic gastritis without bleeding: Secondary | ICD-10-CM | POA: Diagnosis not present

## 2014-09-25 DIAGNOSIS — K648 Other hemorrhoids: Secondary | ICD-10-CM | POA: Diagnosis not present

## 2014-09-25 HISTORY — DX: Fibromyalgia: M79.7

## 2014-09-25 HISTORY — DX: Essential (primary) hypertension: I10

## 2014-09-25 LAB — CBC WITH DIFFERENTIAL/PLATELET
BASOS ABS: 0 10*3/uL (ref 0.0–0.1)
Basophils Relative: 0 % (ref 0–1)
EOS PCT: 2 % (ref 0–5)
Eosinophils Absolute: 0.1 10*3/uL (ref 0.0–0.7)
HEMATOCRIT: 37.1 % (ref 36.0–46.0)
HEMOGLOBIN: 11.8 g/dL — AB (ref 12.0–15.0)
Lymphocytes Relative: 38 % (ref 12–46)
Lymphs Abs: 1.8 10*3/uL (ref 0.7–4.0)
MCH: 27.2 pg (ref 26.0–34.0)
MCHC: 31.8 g/dL (ref 30.0–36.0)
MCV: 85.5 fL (ref 78.0–100.0)
MONOS PCT: 6 % (ref 3–12)
Monocytes Absolute: 0.3 10*3/uL (ref 0.1–1.0)
Neutro Abs: 2.5 10*3/uL (ref 1.7–7.7)
Neutrophils Relative %: 54 % (ref 43–77)
Platelets: 197 10*3/uL (ref 150–400)
RBC: 4.34 MIL/uL (ref 3.87–5.11)
RDW: 15.3 % (ref 11.5–15.5)
WBC: 4.6 10*3/uL (ref 4.0–10.5)

## 2014-09-25 LAB — BASIC METABOLIC PANEL
ANION GAP: 7 (ref 5–15)
BUN: 20 mg/dL (ref 6–20)
CALCIUM: 9.1 mg/dL (ref 8.9–10.3)
CO2: 28 mmol/L (ref 22–32)
Chloride: 104 mmol/L (ref 101–111)
Creatinine, Ser: 0.93 mg/dL (ref 0.44–1.00)
GFR calc Af Amer: >60 mL/min (ref >60)
Glucose, Bld: 93 mg/dL (ref 65–99)
Potassium: 4.8 mmol/L (ref 3.5–5.1)
SODIUM: 139 mmol/L (ref 135–145)

## 2014-09-26 ENCOUNTER — Ambulatory Visit (HOSPITAL_COMMUNITY)
Admission: RE | Admit: 2014-09-26 | Discharge: 2014-09-26 | Disposition: A | Discharge status: Home / Self Care | Payer: Medicare Other | Source: Ambulatory Visit | Attending: Gastroenterology | Admitting: Gastroenterology

## 2014-09-26 ENCOUNTER — Ambulatory Visit (HOSPITAL_COMMUNITY): Payer: Medicare Other | Admitting: Anesthesiology

## 2014-09-26 ENCOUNTER — Encounter (HOSPITAL_COMMUNITY): Payer: Self-pay | Admitting: *Deleted

## 2014-09-26 ENCOUNTER — Encounter (HOSPITAL_COMMUNITY)
Admission: RE | Disposition: A | Discharge status: Home / Self Care | Payer: Self-pay | Source: Ambulatory Visit | Attending: Gastroenterology

## 2014-09-26 ENCOUNTER — Other Ambulatory Visit: Payer: Self-pay

## 2014-09-26 DIAGNOSIS — K648 Other hemorrhoids: Secondary | ICD-10-CM | POA: Insufficient documentation

## 2014-09-26 DIAGNOSIS — R1314 Dysphagia, pharyngoesophageal phase: Secondary | ICD-10-CM

## 2014-09-26 DIAGNOSIS — K2970 Gastritis, unspecified, without bleeding: Secondary | ICD-10-CM | POA: Diagnosis not present

## 2014-09-26 DIAGNOSIS — K2950 Unspecified chronic gastritis without bleeding: Secondary | ICD-10-CM | POA: Diagnosis not present

## 2014-09-26 DIAGNOSIS — R1310 Dysphagia, unspecified: Secondary | ICD-10-CM | POA: Insufficient documentation

## 2014-09-26 DIAGNOSIS — R197 Diarrhea, unspecified: Secondary | ICD-10-CM | POA: Insufficient documentation

## 2014-09-26 DIAGNOSIS — K219 Gastro-esophageal reflux disease without esophagitis: Secondary | ICD-10-CM | POA: Insufficient documentation

## 2014-09-26 DIAGNOSIS — E039 Hypothyroidism, unspecified: Secondary | ICD-10-CM | POA: Insufficient documentation

## 2014-09-26 DIAGNOSIS — R109 Unspecified abdominal pain: Secondary | ICD-10-CM | POA: Insufficient documentation

## 2014-09-26 DIAGNOSIS — R110 Nausea: Secondary | ICD-10-CM | POA: Insufficient documentation

## 2014-09-26 DIAGNOSIS — Z01810 Encounter for preprocedural cardiovascular examination: Secondary | ICD-10-CM | POA: Insufficient documentation

## 2014-09-26 DIAGNOSIS — Z01812 Encounter for preprocedural laboratory examination: Secondary | ICD-10-CM | POA: Insufficient documentation

## 2014-09-26 DIAGNOSIS — Z79899 Other long term (current) drug therapy: Secondary | ICD-10-CM | POA: Insufficient documentation

## 2014-09-26 HISTORY — PX: BIOPSY: SHX5522

## 2014-09-26 HISTORY — PX: ESOPHAGOGASTRODUODENOSCOPY (EGD) WITH PROPOFOL: SHX5813

## 2014-09-26 HISTORY — PX: COLONOSCOPY WITH PROPOFOL: SHX5780

## 2014-09-26 LAB — FERRITIN: Ferritin: 9 ng/mL — ABNORMAL LOW (ref 11–307)

## 2014-09-26 SURGERY — COLONOSCOPY WITH PROPOFOL
Anesthesia: Monitor Anesthesia Care

## 2014-09-26 MED ORDER — PROPOFOL INFUSION 10 MG/ML OPTIME
INTRAVENOUS | Status: DC | PRN
  Administered 2014-09-26: 10:00:00 via INTRAVENOUS
  Administered 2014-09-26: 100 ug/kg/min via INTRAVENOUS

## 2014-09-26 MED ORDER — FENTANYL CITRATE (PF) 100 MCG/2ML IJ SOLN
INTRAMUSCULAR | Status: AC
  Filled 2014-09-26: qty 2

## 2014-09-26 MED ORDER — GLYCOPYRROLATE 0.2 MG/ML IJ SOLN
INTRAMUSCULAR | Status: AC
  Filled 2014-09-26: qty 1

## 2014-09-26 MED ORDER — MIDAZOLAM HCL 5 MG/5ML IJ SOLN
INTRAMUSCULAR | Status: DC | PRN
  Administered 2014-09-26: 2 mg via INTRAVENOUS

## 2014-09-26 MED ORDER — PROPOFOL 10 MG/ML IV BOLUS
INTRAVENOUS | Status: AC
Start: 2014-09-26 — End: 2014-09-26
  Filled 2014-09-26: qty 20

## 2014-09-26 MED ORDER — OMEPRAZOLE 20 MG PO CPDR: DELAYED_RELEASE_CAPSULE | ORAL | Status: DC

## 2014-09-26 MED ORDER — PROPOFOL 10 MG/ML IV BOLUS
INTRAVENOUS | Status: AC
  Filled 2014-09-26: qty 20

## 2014-09-26 MED ORDER — ONDANSETRON HCL 4 MG/2ML IJ SOLN
4.0000 mg | Freq: Once | INTRAMUSCULAR | Status: AC
  Administered 2014-09-26: 4 mg via INTRAVENOUS

## 2014-09-26 MED ORDER — MIDAZOLAM HCL 2 MG/2ML IJ SOLN
1.0000 mg | INTRAMUSCULAR | Status: DC | PRN
  Administered 2014-09-26: 2 mg via INTRAVENOUS

## 2014-09-26 MED ORDER — FENTANYL CITRATE (PF) 100 MCG/2ML IJ SOLN: 25.0000 ug | INTRAMUSCULAR | Status: DC | PRN

## 2014-09-26 MED ORDER — LIDOCAINE HCL (CARDIAC) 10 MG/ML IV SOLN
INTRAVENOUS | Status: DC | PRN
  Administered 2014-09-26: 50 mg via INTRAVENOUS

## 2014-09-26 MED ORDER — MIDAZOLAM HCL 2 MG/2ML IJ SOLN
INTRAMUSCULAR | Status: AC
  Filled 2014-09-26: qty 2

## 2014-09-26 MED ORDER — LIDOCAINE VISCOUS 2 % MT SOLN
15.0000 mL | Freq: Once | OROMUCOSAL | Status: AC
  Administered 2014-09-26: 15 mL via OROMUCOSAL
  Filled 2014-09-26: qty 15

## 2014-09-26 MED ORDER — STERILE WATER FOR IRRIGATION IR SOLN
Status: DC | PRN
  Administered 2014-09-26: 1000 mL

## 2014-09-26 MED ORDER — ONDANSETRON HCL 4 MG/2ML IJ SOLN
INTRAMUSCULAR | Status: AC
  Filled 2014-09-26: qty 2

## 2014-09-26 MED ORDER — WATER FOR IRRIGATION, STERILE IR SOLN
Status: DC | PRN
  Administered 2014-09-26: 1000 mL

## 2014-09-26 MED ORDER — GLYCOPYRROLATE 0.2 MG/ML IJ SOLN
0.2000 mg | Freq: Once | INTRAMUSCULAR | Status: AC
  Administered 2014-09-26: 0.2 mg via INTRAVENOUS

## 2014-09-26 MED ORDER — ONDANSETRON HCL 4 MG/2ML IJ SOLN: 4.0000 mg | Freq: Once | INTRAMUSCULAR | Status: DC | PRN

## 2014-09-26 MED ORDER — MIDAZOLAM HCL 2 MG/2ML IJ SOLN
INTRAMUSCULAR | Status: AC
  Filled 2014-09-26: qty 4

## 2014-09-26 MED ORDER — FENTANYL CITRATE (PF) 100 MCG/2ML IJ SOLN
25.0000 ug | INTRAMUSCULAR | Status: AC
  Administered 2014-09-26 (×2): 25 ug via INTRAVENOUS

## 2014-09-26 MED ORDER — LACTATED RINGERS IV SOLN
INTRAVENOUS | Status: DC
  Administered 2014-09-26: 09:00:00 via INTRAVENOUS

## 2014-09-26 SURGICAL SUPPLY — 9 items
BLOCK BITE 60FR ADLT L/F BLUE (MISCELLANEOUS) ×4 IMPLANT
FLOOR PAD 36X40 (MISCELLANEOUS) ×4
FORCEPS BIOP RAD 4 LRG CAP 4 (CUTTING FORCEPS) ×8 IMPLANT
FORMALIN 10 PREFIL 20ML (MISCELLANEOUS) ×12 IMPLANT
KIT ENDO PROCEDURE PEN (KITS) ×4 IMPLANT
MANIFOLD NEPTUNE II (INSTRUMENTS) ×4 IMPLANT
PAD FLOOR 36X40 (MISCELLANEOUS) ×2 IMPLANT
TUBING IRRIGATION ENDOGATOR (MISCELLANEOUS) ×4 IMPLANT
WATER STERILE IRR 1000ML POUR (IV SOLUTION) ×4 IMPLANT

## 2014-09-26 NOTE — Interval H&P Note (Signed)
History and Physical Interval Note:  09/26/2014 8:37 AM  Bonnie Reynolds  has presented today for surgery, with the diagnosis of rectal bleeding, dysphagia, diarrhea  The various methods of treatment have been discussed with the patient and family. After consideration of risks, benefits and other options for treatment, the patient has consented to  Procedure(s) with comments: COLONOSCOPY WITH PROPOFOL (N/A) - 915-moved to 930 ESOPHAGOGASTRODUODENOSCOPY (EGD) WITH PROPOFOL (N/A) ESOPHAGEAL DILATION (N/A) as a surgical intervention .  The patient's history has been reviewed, patient examined, no change in status, stable for surgery.  I have reviewed the patient's chart and labs.  Questions were answered to the patient's satisfaction.     [employer]

## 2014-09-26 NOTE — Anesthesia Preprocedure Evaluation (Signed)
Anesthesia Evaluation  Patient identified by MRN, date of birth, ID band Patient awake    Reviewed: Allergy & Precautions, NPO status , Patient's Chart, lab work & pertinent test results  Airway Mallampati: II  TM Distance: >3 FB     Dental  (+) Teeth Intact, Implants   Pulmonary neg pulmonary ROS,  breath sounds clear to auscultation        Cardiovascular hypertension, Pt. on medications Rhythm:Regular Rate:Normal     Neuro/Psych    GI/Hepatic GERD-  Medicated and Poorly Controlled,  Endo/Other  Hypothyroidism   Renal/GU      Musculoskeletal  (+) Fibromyalgia -  Abdominal   Peds  Hematology   Anesthesia Other Findings   Reproductive/Obstetrics                             Anesthesia Physical Anesthesia Plan  ASA: III  Anesthesia Plan: MAC   Post-op Pain Management:    Induction: Intravenous  Airway Management Planned: Simple Face Mask  Additional Equipment:   Intra-op Plan:   Post-operative Plan:   Informed Consent: I have reviewed the patients History and Physical, chart, labs and discussed the procedure including the risks, benefits and alternatives for the proposed anesthesia with the patient or authorized representative who has indicated his/her understanding and acceptance.     Plan Discussed with:   Anesthesia Plan Comments:         Anesthesia Quick Evaluation

## 2014-09-26 NOTE — Transfer of Care (Signed)
Immediate Anesthesia Transfer of Care Note  Patient: Bonnie Reynolds  Procedure(s) Performed: Procedure(s) with comments: COLONOSCOPY WITH PROPOFOL (N/A) - cecum time in=1017  time out=1028  total withdrawal time=45minutes procedure 1 ESOPHAGOGASTRODUODENOSCOPY (EGD) WITH PROPOFOL (N/A) BIOPSY - random colon, duodenal   Patient Location: PACU  Anesthesia Type:MAC  Level of Consciousness: awake, alert , oriented and patient cooperative  Airway & Oxygen Therapy: Patient Spontanous Breathing and Patient connected to face mask oxygen  Post-op Assessment: Report given to RN and Post -op Vital signs reviewed and stable  Post vital signs: Reviewed and stable  Last Vitals:  Filed Vitals:   09/26/14 0945  BP: 141/79  Pulse:   Temp:   Resp: 11    Complications: No apparent anesthesia complications

## 2014-09-26 NOTE — Discharge Instructions (Signed)
NO SOURCE FOR YOUR DIARRHEA/ABDOMINAL PAIN OR DYSPHAGIA WAS IDENTIFIED. You have internal hemorrhoids, WHICH MAY CAUSE RECTAL BLEEDING. 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 I biopsied your stomach, SMALL BOWEL, & colon.    YOU DID DEVELOP STRIDOR DURING YOUR UPPER ENDOSCOPY. YOU SHOULD LET ANY DOCTOR KNOW THIS WHEN YOU ARE HAVING A PROCEDURE DONE.  FOLLOW A HIGH FIBER/LOW FAT/DAIRY FREE DIET. AVOID ITEMS THAT CAUSE BLOATING. SEE INFO BELOW.  TAKE OMEPRAZOLE 30 MINUTES PRIOR TO MEALS TWICE DAILY FOR 3 MOS THE ONCE DAILY.  AVOID ITEMS THAT TRIGGER GASTRITIS AND REFLUX. SEE INFO BELOW.  YOUR BIOPSY RESULTS WILL BE AVAILABLE IN MY CHART AFTER AUG 4 AND MY OFFICE WILL CONTACT YOU IN 10-14 DAYS WITH YOUR RESULTS.   YOU NEED A BARIUM PILL ESOPHAGRAM TO COMPLETE YOUR EVALUATION OF YOUR SWALLOWING.  FOLLOW UP IN NOV 2016.  Next colonoscopy IN 10-15 YEARS IF THE BENEFITS OUTWEIGH THE RISKS.   ENDOSCOPY Care After Read the instructions outlined below and refer to this sheet in the next week. These discharge instructions provide you with general information on caring for yourself after you leave the hospital. While your treatment has been planned according to the most current medical practices available, unavoidable complications occasionally occur. If you have any problems or questions after discharge, call DR. FIELDS, [phone].  ACTIVITY  You may resume your regular activity, but move at a slower pace for the next 24 hours.   Take frequent rest periods for the next 24 hours.   Walking will help get rid of the air and reduce the bloated feeling in your belly (abdomen).   No driving for 24 hours (because of the medicine (anesthesia) used during the test).   You may shower.   Do not sign any important legal documents or operate any machinery for 24 hours (because of the anesthesia used during the test).    NUTRITION  Drink plenty of fluids.   You may resume your normal diet as  instructed by your doctor.   Begin with a light meal and progress to your normal diet. Heavy or fried foods are harder to digest and may make you feel sick to your stomach (nauseated).   Avoid alcoholic beverages for 24 hours or as instructed.    MEDICATIONS  You may resume your normal medications.   WHAT YOU CAN EXPECT TODAY  Some feelings of bloating in the abdomen.   Passage of more gas than usual.   Spotting of blood in your stool or on the toilet paper  .  IF YOU HAD POLYPS REMOVED DURING THE ENDOSCOPY:  Eat a soft diet IF YOU HAVE NAUSEA, BLOATING, ABDOMINAL PAIN, OR VOMITING.    FINDING OUT THE RESULTS OF YOUR TEST Not all test results are available during your visit. DR. Darrick Penna WILL CALL YOU WITHIN 14 DAYS OF YOUR PROCEDUE WITH YOUR RESULTS. Do not assume everything is normal if you have not heard from DR. FIELDS, CALL HER OFFICE AT [phone].  SEEK IMMEDIATE MEDICAL ATTENTION AND CALL THE OFFICE: [phone] IF:  You have more than a spotting of blood in your stool.   Your belly is swollen (abdominal distention).   You are nauseated or vomiting.   You have a temperature over 101F.   You have abdominal pain or discomfort that is severe or gets worse throughout the day.    Lifestyle and home remedies  You may eliminate or reduce the frequency of heartburn by making the following lifestyle changes:   Control  your weight. Being overweight is a major risk factor for heartburn and GERD. Excess pounds put pressure on your abdomen, pushing up your stomach and causing acid to back up into your esophagus.    Eat smaller meals. 4 TO 6 MEALS A DAY. This reduces pressure on the lower esophageal sphincter, helping to prevent the valve from opening and acid from washing back into your esophagus.    Loosen your belt. Clothes that fit tightly around your waist put pressure on your abdomen and the lower esophageal sphincter.    Eliminate heartburn triggers.  Everyone has specific triggers. Common triggers such as fatty or fried foods, spicy food, tomato sauce, carbonated beverages, alcohol, chocolate, mint, garlic, onion, caffeine and nicotine may make heartburn worse.    Avoid stooping or bending. Tying your shoes is OK. Bending over for longer periods to weed your garden isn't, especially soon after eating.    Don't lie down after a meal. Wait at least three to four hours after eating before going to bed, and don't lie down right after eating.    Alternative medicine  Several home remedies exist for treating GERD, but they provide only temporary relief. They include drinking baking soda (sodium bicarbonate) added to water or drinking other fluids such as baking soda mixed with cream of tartar and water.   Although these liquids create temporary relief by neutralizing, washing away or buffering acids, eventually they aggravate the situation by adding gas and fluid to your stomach, increasing pressure and causing more acid reflux. Further, adding more sodium to your diet may increase your blood pressure and add stress to your heart, and excessive bicarbonate ingestion can alter the acid-base balance in your body.  Gastritis  Gastritis is an inflammation (the body's way of reacting to injury and/or infection) of the stomach. It is often caused by viral or bacterial (germ) infections. It can also be caused BY ASPIRIN, BC/GOODY POWDER'S, (IBUPROFEN) MOTRIN, OR ALEVE (NAPROXEN), chemicals (including alcohol), SPICY FOODS, and medications. This illness may be associated with generalized malaise (feeling tired, not well), UPPER ABDOMINAL STOMACH cramps, and fever. One common bacterial cause of gastritis is an organism known as H. Pylori. This can be treated with antibiotics.    Hiatal Hernia A hiatal hernia occurs when a part of the stomach slides above the diaphragm. The diaphragm is the thin muscle separating the belly (abdomen) from the chest. A  hiatal hernia can be something you are born with or develop over time. Hiatal hernias may allow stomach acid to flow back into your esophagus, the tube which carries food from your mouth to your stomach. If this acid causes problems it is called GERD (gastro-esophageal reflux disease).   SYMPTOMS Common symptoms of GERD are heartburn (burning in your chest). This is worse when lying down or bending over. It may also cause belching and indigestion. Some of the things which make GERD worse are:  Increased weight pushes on stomach making acid rise more easily.   Smoking markedly increases acid production.   Alcohol decreases lower esophageal sphincter pressure (valve between stomach and esophagus), allowing acid from stomach into esophagus.   Late evening meals and going to bed with a full stomach increases pressure.   HOME CARE INSTRUCTIONS  Try to achieve and maintain an ideal body weight.   Avoid drinking alcoholic beverages.   DO NOT smokE.   Do not wear tight clothing around your chest or stomach.   Eat smaller meals and eat more frequently. This keeps  your stomach from getting too full. Eat slowly.   Do not lie down for 2 or 3 hours after eating. Do not eat or drink anything 1 to 2 hours before going to bed.   Avoid caffeine beverages (colas, coffee, cocoa, tea), fatty foods, citrus fruits and all other foods and drinks that contain acid and that seem to increase the problems.   Avoid bending over, especially after eating OR STRAINING. Anything that increases the pressure in your belly increases the amount of acid that may be pushed up into your esophagus.    High-Fiber Diet A high-fiber diet changes your normal diet to include more whole grains, legumes, fruits, and vegetables. Changes in the diet involve replacing refined carbohydrates with unrefined foods. The calorie level of the diet is essentially unchanged. The Dietary Reference Intake (recommended amount) for adult males  is 38 grams per day. For adult females, it is 25 grams per day. Pregnant and lactating women should consume 28 grams of fiber per day. Fiber is the intact part of a plant that is not broken down during digestion. Functional fiber is fiber that has been isolated from the plant to provide a beneficial effect in the body. PURPOSE  Increase stool bulk.   Ease and regulate bowel movements.   Lower cholesterol.  INDICATIONS THAT YOU NEED MORE FIBER  Constipation and hemorrhoids.   Uncomplicated diverticulosis (intestine condition) and irritable bowel syndrome.   Weight management.   As a protective measure against hardening of the arteries (atherosclerosis), diabetes, and cancer.   GUIDELINES FOR INCREASING FIBER IN THE DIET  Start adding fiber to the diet slowly. A gradual increase of about 5 more grams (2 slices of whole-wheat bread, 2 servings of most fruits or vegetables, or 1 bowl of high-fiber cereal) per day is best. Too rapid an increase in fiber may result in constipation, flatulence, and bloating.   Drink enough water and fluids to keep your urine clear or pale yellow. Water, juice, or caffeine-free drinks are recommended. Not drinking enough fluid may cause constipation.   Eat a variety of high-fiber foods rather than one type of fiber.   Try to increase your intake of fiber through using high-fiber foods rather than fiber pills or supplements that contain small amounts of fiber.   The goal is to change the types of food eaten. Do not supplement your present diet with high-fiber foods, but replace foods in your present diet.  INCLUDE A VARIETY OF FIBER SOURCES  Replace refined and processed grains with whole grains, canned fruits with fresh fruits, and incorporate other fiber sources. White rice, white breads, and most bakery goods contain little or no fiber.   Brown whole-grain rice, buckwheat oats, and many fruits and vegetables are all good sources of fiber. These include:  broccoli, Brussels sprouts, cabbage, cauliflower, beets, sweet potatoes, white potatoes (skin on), carrots, tomatoes, eggplant, squash, berries, fresh fruits, and dried fruits.   Cereals appear to be the richest source of fiber. Cereal fiber is found in whole grains and bran. Bran is the fiber-rich outer coat of cereal grain, which is largely removed in refining. In whole-grain cereals, the bran remains. In breakfast cereals, the largest amount of fiber is found in those with "bran" in their names. The fiber content is sometimes indicated on the label.   You may need to include additional fruits and vegetables each day.   In baking, for 1 cup white flour, you may use the following substitutions:   1 cup whole-wheat  flour minus 2 tablespoons.   1/2 cup white flour plus 1/2 cup whole-wheat flour.    Lactose Free Diet Lactose is a carbohydrate that is found mainly in milk and milk products, as well as in foods with added milk or whey. Lactose must be digested by the enzyme in order to be used by the body. Lactose intolerance occurs when there is a shortage of lactase. When your body is not able to digest lactose, you may feel sick to your stomach (nausea), bloating, cramping, gas and diarrhea.  There are many dairy products that may be tolerated better than milk by some people:  The use of cultured dairy products such as yogurt, buttermilk, cottage cheese, and sweet acidophilus milk (Kefir) for lactase-deficient individuals is usually well tolerated. This is because the healthy bacteria help digest lactose.   Lactose-hydrolyzed milk (Lactaid) contains 40-90% less lactose than milk and may also be well tolerated.    SPECIAL NOTES  Lactose is a carbohydrates. The major food source is dairy products. Reading food labels is important. Many products contain lactose even when they are not made from milk. Look for the following words: whey, milk solids, dry milk solids, nonfat dry milk powder.  Typical sources of lactose other than dairy products include breads, candies, cold cuts, prepared and processed foods, and commercial sauces and gravies.   All foods must be prepared without milk, cream, or other dairy foods.   Soy milk and lactose-free supplements (LACTASE) may be used as an alternative to milk.   FOOD GROUP ALLOWED/RECOMMENDED AVOID/USE SPARINGLY  BREADS / STARCHES 4 servings or more* Breads and rolls made without milk. [place], [place], or [place] bread. Breads and rolls that contain milk. Prepared mixes such as muffins, biscuits, waffles, pancakes. Sweet rolls, donuts, [place] toast (if made with milk or lactose).  Crackers: Soda crackers, graham crackers. Any crackers prepared without lactose. Zwieback crackers, corn curls, or any that contain lactose.  Cereals: Cooked or dry cereals prepared without lactose (read labels). Cooked or dry cereals prepared with lactose (read labels). Total, Cocoa Krispies. Special K.  Potatoes / Pasta / Rice: Any prepared without milk or lactose. Popcorn. Instant potatoes, frozen [place] fries, scalloped or au gratin potatoes.  VEGETABLES 2 servings or more Fresh, frozen, and canned vegetables. Creamed or breaded vegetables. Vegetables in a cheese sauce or with lactose-containing margarines.  FRUIT 2 servings or more All fresh, canned, or frozen fruits that are not processed with lactose. Any canned or frozen fruits processed with lactose.  MEAT & SUBSTITUTES 2 servings or more (4 to 6 oz. total per day) Plain beef, chicken, fish, [place], lamb, veal, pork, or ham. Kosher prepared meat products. Strained or junior meats that do not contain milk. Eggs, soy meat substitutes, nuts. Scrambled eggs, omelets, and souffles that contain milk. Creamed or breaded meat, fish, or fowl. Sausage products such as wieners, liver sausage, or cold cuts that contain milk solids. Cheese, cottage cheese, or cheese spreads.  MILK None. (See BEVERAGES for milk  substitutes. See DESSERTS for ice cream and frozen desserts.) Milk (whole, 2%, skim, or chocolate). Evaporated, powdered, or condensed milk; malted milk.  SOUPS & COMBINATION FOODS Bouillon, broth, vegetable soups, clear soups, consomms. Homemade soups made with allowed ingredients. Combination or prepared foods that do not contain milk or milk products (read labels). Cream soups, chowders, commercially prepared soups containing lactose. Macaroni and cheese, pizza. Combination or prepared foods that contain milk or milk products.  DESSERTS & SWEETS In moderation Water and fruit  ices; gelatin; angel food cake. Homemade cookies, pies, or cakes made from allowed ingredients. Pudding (if made with water or a milk substitute). Lactose-free tofu desserts. Sugar, honey, corn syrup, jam, jelly; marmalade; molasses (beet sugar); Pure sugar candy; marshmallows. Ice cream, ice milk, sherbet, custard, pudding, frozen yogurt. Commercial cake and cookie mixes. Desserts that contain chocolate. Pie crust made with milk-containing margarine; reduced-calorie desserts made with a sugar substitute that contains lactose. Toffee, peppermint, butterscotch, chocolate, caramels.  FATS & OILS In moderation Butter (as tolerated; contains very small amounts of lactose). Margarines and dressings that do not contain milk, Vegetable oils, shortening, Miracle Whip, mayonnaise, nondairy cream & whipped toppings without lactose or milk solids added (examples: Coffee Rich, Carnation Coffeemate, Rich's Whipped Topping, PolyRich). Tomasa Blase. Margarines and salad dressings containing milk; cream, cream cheese; peanut butter with added milk solids, sour cream, chip dips, made with sour cream.  BEVERAGES Carbonated drinks; tea; coffee and freeze-dried coffee; some instant coffees (check labels). Fruit drinks; fruit and vegetable juice; Rice or Soy milk. Ovaltine, hot chocolate. Some cocoas; some instant coffees; instant iced teas; powdered fruit  drinks (read labels).   CONDIMENTS / MISCELLANEOUS Soy sauce, carob powder, olives, gravy made with water, baker's cocoa, pickles, pure seasonings and spices, wine, pure monosodium glutamate, catsup, mustard. Some chewing gums, chocolate, some cocoas. Certain antibiotics and vitamin / mineral preparations. Spice blends if they contain milk products. MSG extender. Artificial sweeteners that contain lactose such as Equal (Nutra-Sweet) and Sweet 'n Low. Some nondairy creamers (read labels).   SAMPLE MENU*  Breakfast   Orange Juice.  Banana.   Bran flakes.   Nondairy Creamer.  [place]ienna Bread (toasted).   Butter or milk-free margarine.   Coffee or tea.    Noon Meal   Chicken Breast.  Rice.   Green beans.   Butter or milk-free margarine.  Fresh melon.   Coffee or tea.    Evening Meal   Roast Beef.  Baked potato.   Butter or milk-free margarine.   Broccoli.   Lettuce salad with vinegar and oil dressing.  [employer].   Coffee or tea.    Hemorrhoids Hemorrhoids are dilated (enlarged) veins around the rectum. Sometimes clots will form in the veins. This makes them swollen and painful. These are called thrombosed hemorrhoids. Causes of hemorrhoids include:  Constipation.   Straining to have a bowel movement.   HEAVY LIFTING  HOME CARE INSTRUCTIONS  Eat a well balanced diet and drink 6 to 8 glasses of water every day to avoid constipation. You may also use a bulk laxative.   Avoid straining to have bowel movements.   Keep anal area dry and clean.   Do not use a donut shaped pillow or sit on the toilet for long periods. This increases blood pooling and pain.   Move your bowels when your body has the urge; this will require less straining and will decrease pain and pressure.   Hiatal Hernia A hiatal hernia occurs when part of your stomach slides above the muscle that separates your abdomen from your chest (diaphragm). You can be born with a hiatal  hernia (congenital), or it may develop over time. In almost all cases of hiatal hernia, only the top part of the stomach pushes through.  Many people have a hiatal hernia with no symptoms. The larger the hernia, the more likely that you will have symptoms. In some cases, a hiatal hernia allows stomach acid to flow back into the tube that carries food from  your mouth to your stomach (esophagus). This may cause heartburn symptoms. Severe heartburn symptoms may mean you have developed a condition called gastroesophageal reflux disease (GERD).  CAUSES  Hiatal hernias are caused by a weakness in the opening (hiatus) where your esophagus passes through your diaphragm to attach to the upper part of your stomach. You may be born with a weakness in your hiatus, or a weakness can develop. RISK FACTORS Older age is a major risk factor for a hiatal hernia. Anything that increases pressure on your diaphragm can also increase your risk of a hiatal hernia. This includes:  Pregnancy.  Excess weight.  Frequent constipation. SIGNS AND SYMPTOMS  People with a hiatal hernia often have no symptoms. If symptoms develop, they are almost always caused by GERD. They may include:  Heartburn.  Belching.  Indigestion.  Trouble swallowing.  Coughing or wheezing.  Sore throat.  Hoarseness.  Chest pain. DIAGNOSIS  A hiatal hernia is sometimes found during an exam for another problem. Your health care provider may suspect a hiatal hernia if you have symptoms of GERD. Tests may be done to diagnose GERD. These may include:  X-rays of your stomach or chest.  An upper gastrointestinal (GI) series. This is an X-ray exam of your GI tract involving the use of a chalky liquid that you swallow. The liquid shows up clearly on the X-ray.  Endoscopy. This is a procedure to look into your stomach using a thin, flexible tube that has a tiny camera and light on the end of it. TREATMENT  If you have no symptoms, you may not  need treatment. If you have symptoms, treatment may include:  Dietary and lifestyle changes to help reduce GERD symptoms.  Medicines. These may include:  Over-the-counter antacids.  Medicines that make your stomach empty more quickly.  Medicines that block the production of stomach acid (H2 blockers).  Stronger medicines to reduce stomach acid (proton pump inhibitors).  You may need surgery to repair the hernia if other treatments are not helping. HOME CARE INSTRUCTIONS   Take all medicines as directed by your health care provider.  Quit smoking, if you smoke.  Try to achieve and maintain a healthy body weight.  Eat frequent small meals instead of three large meals a day. This keeps your stomach from getting too full.  Eat slowly.  Do not lie down right after eating.  Do noteat 1-2 hours before bed.   Do not drink beverages with caffeine. These include cola, coffee, cocoa, and tea.  Do not drink alcohol.  Avoid foods that can make symptoms of GERD worse. These may include:  Fatty foods.  Citrus fruits.  Other foods and drinks that contain acid.  Avoid putting pressure on your belly. Anything that puts pressure on your belly increases the amount of acid that may be pushed up into your esophagus.   Avoid bending over, especially after eating.  Raise the head of your bed by putting blocks under the legs. This keeps your head and esophagus higher than your stomach.  Do not wear tight clothing around your chest or stomach.  Try not to strain when having a bowel movement, when urinating, or when lifting heavy objects. SEEK MEDICAL CARE IF:  Your symptoms are not controlled with medicines or lifestyle changes.  You are having trouble swallowing.  You have coughing or wheezing that will not go away. SEEK IMMEDIATE MEDICAL CARE IF:  Your pain is getting worse.  Your pain spreads to your arms, neck,  jaw, teeth, or back.  You have shortness of breath.  You  sweat for no reason.  You feel sick to your stomach (nauseous) or vomit.  You vomit blood.  You have bright red blood in your stools.  You have black, tarry stools.  Document Released: 05/03/2003 Document Revised: 06/27/2013 Document Reviewed: 01/28/2013 [facility] Patient Information 2015 [place], [place]. This information is not intended to replace advice given to you by your health care provider. Make sure you discuss any questions you have with your health care provider.

## 2014-09-26 NOTE — Op Note (Signed)
[facility] [address] [place], [address]   COLONOSCOPY PROCEDURE REPORT  PATIENT: Bonnie Reynolds, Bonnie Reynolds  MR#: #[id number] BIRTHDATE: 01/19/1947 , 67  yrs. old GENDER: female ENDOSCOPIST: West Bali, MD REFERRED BY:   Terance Hart, PA-C PROCEDURE DATE:  10/14/2014 PROCEDURE:   Colonoscopy with biopsy INDICATIONS:unexplained diarrhea. MEDICATIONS: Monitored anesthesia care  DESCRIPTION OF PROCEDURE:    Physical exam was performed.  Informed consent was obtained from the patient after explaining the benefits, risks, and alternatives to procedure.  The patient was connected to monitor and placed in left lateral position. Continuous oxygen was provided by nasal cannula and IV medicine administered through an indwelling cannula.  After administration of sedation and rectal exam, the patients rectum was intubated and the     colonoscope was advanced under direct visualization to the ileum.  The scope was removed slowly by carefully examining the color, texture, anatomy, and integrity mucosa on the way out.  The patient was recovered in endoscopy and discharged home in satisfactory condition. Estimated blood loss is zero unless otherwise noted in this procedure report.     COLON FINDINGS: The examined terminal ileum appeared to be normal. , The colonic mucosa appeared normal.  Multiple biopsies were performed.  , and Small internal hemorrhoids were found.  PREP QUALITY: excellent. CECAL W/D TIME: 11       minutes COMPLICATIONS: DEVELOPED STRIDOR DURING EGD  ENDOSCOPIC IMPRESSION: 1.  NO SOURCE FOR DIARRHEA IDENTIFIED 2.  Small internal hemorrhoids  RECOMMENDATIONS: FOLLOW A HIGH FIBER/LOW FAT/DAIRY FREE DIET. OMEPRAZOLE 30 MINUTES PRIOR TO MEALS TWICE DAILY FOR 3 MOS THE ONCE DAILY. AVOID ITEMS THAT TRIGGER GASTRITIS AND REFLUX. AWAIT BIOPSY RESULTS. BARIUM PILL ESOPHAGRAM TO COMPLETE YOUR EVALUATION OF DYSPHAGIA. FOLLOW UP IN NOV 2016. Next colonoscopy  IN 10-15 YEARS IF THE BENEFITS OUTWEIGH THE RISKS.    _______________________________ eSignedWest Bali, MD 2014/10/14 4:19 PM   CPT CODES: ICD CODES:  The ICD and CPT codes recommended by this software are interpretations from the data that the clinical staff has captured with the software.  The verification of the translation of this report to the ICD and CPT codes and modifiers is the sole responsibility of the health care institution and practicing physician where this report was generated.  [employer] will not be held responsible for the validity of the ICD and CPT codes included on this report.  AMA assumes no liability for data contained or not contained herein. CPT is a Publishing rights manager of the [employer].

## 2014-09-26 NOTE — H&P (View-Only) (Signed)
Subjective:    Patient ID: Bonnie Reynolds, female    DOB: 1945-12-05, 69 y.o.   MRN: [id number]  Terance Hart, PA-C  HPI DIARRHEA FOR PAST 3 YEARS. USED TO HAVE FOR 3-4 DAYS THEN MUCOUS THEN WATERY DIARRHEA. MEDS TRIED: ???.  2 MOS AGO LARGE BLACK WATERY STOOLS. LAST WED: LARGE WATERY BOWEL MOVEMENTS, ASSOCIATED WITH PROFUSE SWEATING AND FELT WEAK. HAS SEEN: 2 OTHER GI DOCS IN PAST FOR SAME SX(SHIFLETT, PANDYA). TRIGGERS: LETTUCE SOMETIMES. EATS SWEETS CAUSES SEVER NAUSEA. ACID REFLUX ALL THE TIME. IT BURNS AND THROWING UP BITTER STUFF. WAS 205 LBS WITHIN PAST YEAR. PT STATES SHE'S LOST 10 LBS IN PAST 2 MOS BUT WEIGHED 195 LBS IN APR 2016. MEDS CHANGED YESTERDAY TO 125 MCG. HAS HOT FLASHES. NOR TRAVEL. ABX FOR UTI OFF AND ON FOR PAST 4 MOS. CITY WATER. DRINKS BOTTLED WATER. iFOBT POS JUN 2016. NAUSEA EVERY DAY. LAST NL FORMED STOOL: RARE. SOB: CAN'T WALK A FLIGHT OF STEPS BUT THINKS IT'S BECAUSE SHE'S OLD AND OVERWEIGHT. ABDOMEN (LOWER, CRAMPS) HURTS ALL THE TIME AND LAYS DOWN. RECTAL BLEEDING OFF AND ON(2 BLOW OUT THINGS AND CAN BE SPOTS). LAST ENDOSCOPY WITH MAC. 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 FEELS LIKE EVERYTHING STICKS IN HER CHEST. MED TRIED FOR HEARTBURN: NOTHING. MILK MAY CAUSE LOOSE STOOLS. EATS AND THEN HAS ABDOMINAL PAIN AFTER 3 BITES.  PT DENIES FEVER, CHILLS, HEMATOCHEZIA, vomiting, melena, CHEST PAIN, CHANGE IN BOWEL IN HABITS, OR  problems swallowing.  Past Medical History  Diagnosis Date  . Hypothyroidism   . Neuropathy    Past Surgical History  Procedure Laterality Date  . Abdominal hysterectomy      cancer  . Cholecystectomy    . Appendectomy    . Tonsillectomy    . Back surgery      X 2, cervical and thoracic  . Flexible sigmoidoscopy  04/2013    Dr. Samuella Cota, Propofol: exam to 60cm, abnormal mucosa with edema, erythema and ulceration proximal to 30 cm consistent with ischemic bowel disease. Biopsies taken, we didn't ever receive those records  however. Sigmoid diverticulosis also seen.  . Colonoscopy  08/2006    Dr. Gala Romney Shiflett: Normal colon except for diverticulosis and 2 rectal polyps, one was tubular adenoma.  . Colonoscopy  2004    Dr. Riley Lam Shiflett: unremarkable  . Esophagogastroduodenoscopy  02/2013    Dr. Samuella Cota: small hiatal hernia and mild gastritis. Negative for H pylori.   Allergies  Allergen Reactions  . Codeine Other (See Comments)    Makes he nervous  . Prednisone Swelling   Current Outpatient Prescriptions  Medication Sig Dispense Refill  . levothyroxine (SYNTHROID, LEVOTHROID) 125 MCG tablet Take 125 mcg by mouth daily before breakfast.    . VITAMIN D, CHOLECALCIFEROL, PO Take by mouth daily.    .    5   Family History  Problem Relation Age of Onset  . Colitis Brother     1/2 brother  . Colon cancer Neg Hx   . Colon polyps Neg Hx   . Neuropathy Mother     deceased age 76  . CVA Father     deceased age 60     History   Social History  . Marital Status: Unknown    Spouse Name: N/A  . Number of Children: 2  . Years of Education: N/A   Occupational History  . hair dresser    Social History Main Topics  . Smoking status: Never Smoker   .  Smokeless tobacco: Not on file  . Alcohol Use: Not on file  . Drug Use: Not on file  . Sexual Activity: Not on file    Review of Systems CRAMPING ALL THE TIME IN LEGS/FEET. PER HPI OTHERWISE ALL SYSTEMS ARE NEGATIVE.     Objective:   Physical Exam  Constitutional: She is oriented to person, place, and time. She appears well-developed and well-nourished. No distress.  HENT:  Head: Normocephalic and atraumatic.  Mouth/Throat: Oropharynx is clear and moist. No oropharyngeal exudate.  Eyes: Pupils are equal, round, and reactive to light. No scleral icterus.  Neck: Normal range of motion. Neck supple.  Cardiovascular: Normal rate, regular rhythm and normal heart sounds.   Pulmonary/Chest: Effort normal and breath sounds normal. No respiratory  distress.  Abdominal: Soft. Bowel sounds are normal. She exhibits no distension. There is tenderness. There is no rebound and no guarding.  MILD LLQ TTP   Musculoskeletal: She exhibits no edema.  Lymphadenopathy:    She has no cervical adenopathy.  Neurological: She is alert and oriented to person, place, and time.  NO FOCAL DEFICITS   Psychiatric: She has a normal mood and affect.  Vitals reviewed.         Assessment & Plan:

## 2014-09-26 NOTE — Op Note (Signed)
[facility] [address] [place], [address]   ENDOSCOPY PROCEDURE REPORT  PATIENT: Bonnie, Reynolds  MR#: #[id number] BIRTHDATE: 01/19/1947 , 67  yrs. old GENDER: female  ENDOSCOPIST: West Bali, MD REFERRED BY: PROCEDURE DATE: 10/17/2014 PROCEDURE:   EGD w/ biopsy  INDICATIONS:dysphagia.   DIARRHEA. MEDICATIONS: Monitored anesthesia care TOPICAL ANESTHETIC:   Viscous Xylocaine ASA CLASS:  DESCRIPTION OF PROCEDURE:     Physical exam was performed.  Informed consent was obtained from the patient after explaining the benefits, risks, and alternatives to the procedure.  The patient was connected to the monitor and placed in the left lateral position.  Continuous oxygen was provided by nasal cannula and IV medicine administered through an indwelling cannula.  After administration of sedation, the patients esophagus was intubated and the     endoscope was advanced under direct visualization to the second portion of the duodenum.  The scope was removed slowly by carefully examining the color, texture, anatomy, and integrity of the mucosa on the way out.  The patient was recovered in endoscopy and discharged home in satisfactory condition.  Estimated blood loss is zero unless otherwise noted in this procedure report.     ESOPHAGUS: The mucosa of the esophagus appeared normal.   STOMACH: Mild non-erosive gastritis (inflammation) was found in the gastric antrum.  Multiple biopsies were performed using cold forceps. DUODENUM: The duodenal mucosa showed no abnormalities in the bulb and 2nd part of the duodenum.  Cold forceps biopsies were taken in the bulb and second portion.         COMPLICATIONS: Stridor occurred ONLY 4 COLD FORCEPS BIOPSIES OBTAINED.  EGD NEEDED TO BE ABORTED.  COMPLETE EXAM BUT LIMITED PICTURES.  ENDOSCOPIC IMPRESSION: 1.   NO SOURCE OF DIARRHEA/ABDOMINAL PAIN IDENTIFIED. DYSPHAGIA MOST LIKELY DUE TO GERD 2.   MILD Non-erosive  gastritis  RECOMMENDATIONS: FOLLOW A HIGH FIBER/LOW FAT/DAIRY FREE DIET. OMEPRAZOLE 30 MINUTES PRIOR TO MEALS TWICE DAILY FOR 3 MOS THE ONCE DAILY. AVOID ITEMS THAT TRIGGER GASTRITIS AND REFLUX. AWAIT BIOPSY RESULTS. BARIUM PILL ESOPHAGRAM TO COMPLETE YOUR EVALUATION OF DYSPHAGIA. FOLLOW UP IN NOV 2016. Next colonoscopy IN 10-15 YEARS IF THE BENEFITS OUTWEIGH THE RISKS.  REPEAT EXAM: eSigned:  West Bali, MD 17-Oct-2014 4:23 PM  CPT CODES: ICD CODES:  The ICD and CPT codes recommended by this software are interpretations from the data that the clinical staff has captured with the software.  The verification of the translation of this report to the ICD and CPT codes and modifiers is the sole responsibility of the health care institution and practicing physician where this report was generated.  [employer] will not be held responsible for the validity of the ICD and CPT codes included on this report.  AMA assumes no liability for data contained or not contained herein. CPT is a Publishing rights manager of the [employer].

## 2014-09-26 NOTE — Anesthesia Postprocedure Evaluation (Signed)
  Anesthesia Post-op Note  Patient: Bonnie Reynolds  Procedure(s) Performed: Procedure(s) with comments: COLONOSCOPY WITH PROPOFOL (N/A) - cecum time in=1017  time out=1028  total withdrawal time=57minutes procedure 1 ESOPHAGOGASTRODUODENOSCOPY (EGD) WITH PROPOFOL (N/A) BIOPSY - random colon, duodenal   Patient Location: PACU  Anesthesia Type:MAC  Level of Consciousness: awake, alert , oriented and patient cooperative  Airway and Oxygen Therapy: Patient Spontanous Breathing and Patient connected to face mask oxygen  Post-op Pain: none  Post-op Assessment: Post-op Vital signs reviewed, Patient's Cardiovascular Status Stable, Respiratory Function Stable, Patent Airway and No signs of Nausea or vomiting              Post-op Vital Signs: Reviewed and stable  Last Vitals:  Filed Vitals:   09/26/14 0945  BP: 141/79  Pulse:   Temp:   Resp: 11    Complications: No apparent anesthesia complications

## 2014-09-27 ENCOUNTER — Encounter (HOSPITAL_COMMUNITY): Payer: Self-pay | Admitting: Gastroenterology

## 2014-09-29 ENCOUNTER — Telehealth: Payer: Self-pay | Admitting: Gastroenterology

## 2014-09-29 ENCOUNTER — Other Ambulatory Visit: Payer: Self-pay

## 2014-09-29 DIAGNOSIS — D649 Anemia, unspecified: Secondary | ICD-10-CM

## 2014-09-29 NOTE — Telephone Encounter (Signed)
Pt to pick up instructions on 10/02/2014.   No pre-cert required.

## 2014-09-29 NOTE — Telephone Encounter (Signed)
Please call pt. HER stomach Bx shows mild gastritis. Her colon and small bowel biopsies are normal. HER IRON STORES ARE LOW. NO SOURCE FOR HER ANEMIA WAS IDENTIFIED.   SHE NEEDS [facility] ENDOSCOPY TO EVALUATE HER IRON DEFICIENCY ANEMIA.  YOU DID DEVELOP STRIDOR DURING YOUR UPPER ENDOSCOPY. YOU SHOULD LET ANY DOCTOR KNOW THIS WHEN YOU ARE HAVING A PROCEDURE DONE.  FOLLOW A HIGH FIBER/LOW FAT/DAIRY FREE DIET. AVOID ITEMS THAT CAUSE BLOATING.   TAKE OMEPRAZOLE 30 MINUTES PRIOR TO MEALS TWICE DAILY FOR 3 MOS THE ONCE DAILY.  AVOID ITEMS THAT TRIGGER GASTRITIS AND REFLUX. .  YOU NEED A BARIUM PILL ESOPHAGRAM TO COMPLETE YOUR EVALUATION OF YOUR SWALLOWING.  FOLLOW UP IN NOV 2016 E30 ANEMIA/DYSPHAGIA.  Next colonoscopy IN 10-15 YEARS IF THE BENEFITS OUTWEIGH THE RISKS.

## 2014-09-29 NOTE — Telephone Encounter (Signed)
Spoke with pt and she is aware of results. States that she goes for her BPE on 10/02/2014.  States it is ok to proceed with scheduling the [facility].

## 2014-09-29 NOTE — Telephone Encounter (Signed)
Pt is set for [facility] on 10/09/2014

## 2014-10-02 ENCOUNTER — Ambulatory Visit (HOSPITAL_COMMUNITY)
Admission: RE | Admit: 2014-10-02 | Discharge: 2014-10-02 | Disposition: A | Discharge status: Home / Self Care | Payer: Medicare Other | Source: Ambulatory Visit | Attending: Gastroenterology | Admitting: Gastroenterology

## 2014-10-02 DIAGNOSIS — K449 Diaphragmatic hernia without obstruction or gangrene: Secondary | ICD-10-CM | POA: Insufficient documentation

## 2014-10-02 DIAGNOSIS — K224 Dyskinesia of esophagus: Secondary | ICD-10-CM | POA: Diagnosis not present

## 2014-10-02 DIAGNOSIS — R1314 Dysphagia, pharyngoesophageal phase: Secondary | ICD-10-CM | POA: Insufficient documentation

## 2014-10-02 DIAGNOSIS — K219 Gastro-esophageal reflux disease without esophagitis: Secondary | ICD-10-CM | POA: Insufficient documentation

## 2014-10-02 NOTE — Telephone Encounter (Signed)
REMINDER IN EPIC °

## 2014-10-05 NOTE — Telephone Encounter (Signed)
Pt is aware of results. 

## 2014-10-05 NOTE — Telephone Encounter (Addendum)
Please call pt. Her study shows she has a weak esophagus. AND A Hiatal hernia with gastroesophageal reflux.  FOLLOW A HIGH FIBER/LOW FAT/DAIRY FREE DIET. AVOID ITEMS THAT CAUSE BLOATING.   TAKE OMEPRAZOLE 30 MINUTES PRIOR TO MEALS TWICE DAILY FOR 3 MOS THE ONCE DAILY.  AVOID ITEMS THAT TRIGGER GASTRITIS AND REFLUX. Marland Kitchen  FOLLOW UP IN NOV 2016 E30 ANEMIA/DYSPHAGIA.

## 2014-10-09 ENCOUNTER — Encounter (HOSPITAL_COMMUNITY)
Admission: RE | Disposition: A | Discharge status: Home / Self Care | Payer: Self-pay | Source: Ambulatory Visit | Attending: Gastroenterology

## 2014-10-09 ENCOUNTER — Ambulatory Visit (HOSPITAL_COMMUNITY)
Admission: RE | Admit: 2014-10-09 | Discharge: 2014-10-09 | Disposition: A | Discharge status: Home / Self Care | Payer: Medicare Other | Source: Ambulatory Visit | Attending: Gastroenterology | Admitting: Gastroenterology

## 2014-10-09 DIAGNOSIS — K633 Ulcer of intestine: Secondary | ICD-10-CM | POA: Diagnosis not present

## 2014-10-09 DIAGNOSIS — R197 Diarrhea, unspecified: Secondary | ICD-10-CM | POA: Diagnosis not present

## 2014-10-09 DIAGNOSIS — D509 Iron deficiency anemia, unspecified: Secondary | ICD-10-CM | POA: Insufficient documentation

## 2014-10-09 HISTORY — PX: GIVENS CAPSULE STUDY: SHX5432

## 2014-10-09 SURGERY — IMAGING PROCEDURE, GI TRACT, INTRALUMINAL, VIA CAPSULE

## 2014-10-11 ENCOUNTER — Encounter (HOSPITAL_COMMUNITY): Payer: Self-pay | Admitting: Gastroenterology

## 2014-10-12 NOTE — Procedures (Addendum)
Pt has FEDA-ferritin 9. AUG 2016 EGD/TCS NL DUODENAL/COLON Bx. OCCASIONALLY USES ALEVE.   PATIENT DATA: GASTRIC PASSAGE TIME: 1h 54m, SB PASSAGE TIME: 2H70m  RESULTS: LIMITED views of gastric mucosa due to retained contents. No blood in the stomach. mutliple small bowel ulcers starting 1:52 and extending to 2:39. No masses, or AVMs SEEN. NO OLD BLOOD OR FRESH BLOOD SEEN. LIMITED VIEWS OF THE COLON DUE TO RETAINED CONTENTS but possible AVM IN THE RIGHT COLON.  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 [facility] IBD panel 2. CONSIDER ENTOCORT +/- MESALAMINE OR OFFER ANTEGRADE DBE AT [facility] 3. AVOID ASA/NSAIDS  3. OPV IN 3-4 MOS

## 2014-10-13 ENCOUNTER — Telehealth: Payer: Self-pay | Admitting: Gastroenterology

## 2014-10-13 ENCOUNTER — Telehealth: Payer: Self-pay

## 2014-10-13 MED ORDER — PROMETHAZINE HCL 12.5 MG PO TABS: ORAL_TABLET | ORAL | Status: DC

## 2014-10-13 MED ORDER — MELOXICAM 7.5 MG PO TABS: ORAL_TABLET | ORAL | Status: DC

## 2014-10-13 NOTE — Telephone Encounter (Signed)
Pt is very nausea and she is wanting to know if we could call her in something to [facility] in [place]. Please advise

## 2014-10-13 NOTE — Telephone Encounter (Addendum)
Called patient TO DISCUSS CONCERNS. BACK HURTS AND RECTUM HURTS. NEEDS SOMETHING FOR NAUSEA & PAIN. WAS TAKING ALEVE IN PAST 3 WEEKS-1 A DAY. DISCUSSED RESULTS OF [facility] STUDY. NEED PROMETHEUS IBD PANEL BY NOON AUG 22. FAX ORDER TO [facility] IN [place], [place]. RX FOR PROMETHAZINE AND MOBIC SENT.  PT DOES NOT HAVE Rx DRUG COVERAGE.

## 2014-10-13 NOTE — Telephone Encounter (Signed)
Pt is anxious to know her results from when she had the camera study and is also still having problems per husband. I told husband results may not be back yet, but I would put a note in for the nurse. He would like a call to his number at [phone] or [phone]

## 2014-10-13 NOTE — Telephone Encounter (Signed)
I called and spoke to the pt's husband. He is aware we have 7-10 business days to call with results.  She was just hoping that maybe we had those earlier, said sometimes we call earlier. He said she is just sick, and trying to work, but she will not hardly complain. Said she feels sick most of the time and eats very little. I asked him if it was just the nausea and he said he didn't know, but he thought so. I told him if he would have pt call me so I can find out exactly what is going on I would be glad to let Dr. Darrick Penna know. He said he will have her call and he is aware that we close at noon today.

## 2014-10-16 NOTE — Telephone Encounter (Signed)
I called and told pt that it might be possible for the mouth blisters to be coming from IBS. I will route to Dr. Darrick Penna for her advise.  I had faxed the [facility] lab to [facility] and they called and cannot do it there.  So pt is aware to come to [facility] and she said that she will come tomorrow.

## 2014-10-16 NOTE — Telephone Encounter (Signed)
See later note of 10/13/2014.

## 2014-10-16 NOTE — Telephone Encounter (Signed)
Pt called and wants to make sure about the labs for [facility]. States she is also having issues with blisters in her mouth and her tongue wants to know if that is coming from the Irritable bowel

## 2014-10-17 ENCOUNTER — Other Ambulatory Visit: Payer: Self-pay | Admitting: Gastroenterology

## 2014-10-17 NOTE — Telephone Encounter (Signed)
Pt is aware and she did the [facility] Lab this AM.

## 2014-10-17 NOTE — Telephone Encounter (Signed)
REVIEWED. PLEASE CALL PT. INFLAMMATORY BOWEL DISEASE NOT IBS CAN BE ASSOCIATED WITH MOUTH ULCERS.

## 2014-10-18 LAB — PROMETHEUS-MAIL

## 2014-10-19 ENCOUNTER — Telehealth: Payer: Self-pay | Admitting: Gastroenterology

## 2014-10-19 MED ORDER — HYOSCYAMINE SULFATE 0.125 MG SL SUBL: SUBLINGUAL_TABLET | SUBLINGUAL | Status: DC

## 2014-10-19 NOTE — Telephone Encounter (Signed)
REVIEWED-NO ADDITIONAL RECOMMENDATIONS. 

## 2014-10-19 NOTE — Telephone Encounter (Signed)
Pt is aware.  

## 2014-10-19 NOTE — Telephone Encounter (Signed)
PATIENT ALSO WANTED TO LET THE NURSE KNOW THAT SHE IS GOING TO THE BATHROOM AND IT IS GREEN .  SHE HAS NEVER HAD ANYTHING LIKE THIS BEFORE

## 2014-10-19 NOTE — Telephone Encounter (Signed)
Patient's husband called to see if patient's lab results were available that she had done Tuesday. I told him it takes 7-10 business days for [place] to get results, have them reviewed and signed off on by the doctor and then the nurse would call the patient. He said that the patient was so sick and not getting any better, but she is still working.  Please call and advise if there is something to be called in to help her. [phone]

## 2014-10-19 NOTE — Telephone Encounter (Signed)
I called pt and she was so apologetic for calling. I told her not to worry about that. She has had 4 watery stools today light green, yesterday was dark green. Her diet has not changed, she is hardly eating anything. Few crackers and sips of tea.She feels so bad and so weak. She just doesn't know what she is going to do if she doesn't get something. Please advise!

## 2014-10-19 NOTE — Telephone Encounter (Signed)
Routing note to Dr. Darrick Penna.

## 2014-10-19 NOTE — Telephone Encounter (Addendum)
REVIEWED. PLEASE CALL PT. SHE NEEDS TO TAKE MEDS AS NEEDED FOR NAUSEA. START HYOSCYAMINE SL AT 8 AM, 12N AND 4 PM. IT WILL HELP MANAGE DIARRHEA.  IT MAY CAUSE DROWSINESS, DRY EYES/MOUTH, BLURRY VISION, OR DIFFICULTY URINATING.

## 2014-10-19 NOTE — Addendum Note (Signed)
Addended by: West Bali on: 10/19/2014 04:16 PM   Modules accepted: Orders

## 2014-10-19 NOTE — Telephone Encounter (Signed)
I called pt's husband and told him it would probably take a week for the [facility] results. He will check the pharmacy for the mobic, he doesn't think that she has that yet. She does have some nausea medication.

## 2014-11-01 ENCOUNTER — Telehealth: Payer: Self-pay | Admitting: Gastroenterology

## 2014-11-01 ENCOUNTER — Other Ambulatory Visit: Payer: Self-pay

## 2014-11-01 NOTE — Telephone Encounter (Signed)
Pt is aware of the procedure on 11/07/14 @ 7:30.

## 2014-11-01 NOTE — Telephone Encounter (Signed)
PT's husband is aware Dr. Evelina Dun has the results and will be reviewing and signing off today and we will call. PT's number is [phone]. IF we can not reach her it is OK to call husband at [phone].

## 2014-11-01 NOTE — Telephone Encounter (Signed)
Patient's husband called to see if his wife's results were back. I told him the nurse would be calling once the results were available and he said it's been some time already. He has been told in the past that it normally takes 7-10 business days. I transferred call to DS VM

## 2014-11-01 NOTE — Telephone Encounter (Signed)
ONLY TOOK ONE PILL FOR DIARRHEA. TOOK IT AND HAD CONSTIPATION.  MOBIC DIDN'T WORK FOR PAIN. FIRST TIME DIZZY AND NOT ADEQUATE PAIN RELIEF. RAISING GRANDKIDS AS WELL. PT NEEDS ENTEROSCOPY WITH SLIM COLONOSCOPE W/ MAC NEXT TUES SEP 13.

## 2014-11-01 NOTE — Telephone Encounter (Signed)
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 WILL CALL BACK WHEN HE PULLS OVER.

## 2014-11-02 NOTE — Patient Instructions (Signed)
    Kelechi Astarita  11/02/2014     @   Your procedure is scheduled on 11/07/2014.  Report to [facility] at 6:15 A.M.  Call this number if you have problems the morning of surgery:  [phone]   Remember:  Do not eat food or drink liquids after midnight.  Take these medicines the morning of surgery with A SIP OF WATER: Prilosec, Lisinopril and Synthroid   Do not wear jewelry, make-up or nail polish.  Do not wear lotions, powders, or perfumes.  You may wear deodorant.  Do not shave 48 hours prior to surgery.  Men may shave face and neck.  Do not bring valuables to the hospital.  [facility] is not responsible for any belongings or valuables.  Contacts, dentures or bridgework may not be worn into surgery.  Leave your suitcase in the car.  After surgery it may be brought to your room.  For patients admitted to the hospital, discharge time will be determined by your treatment team.  Patients discharged the day of surgery will not be allowed to drive home.   Name and phone number of your driver: Family   Special instructions:  n/a  Please read over the following fact sheets that you were given. Care and Recovery After Surgery

## 2014-11-03 ENCOUNTER — Encounter (HOSPITAL_COMMUNITY)
Admission: RE | Admit: 2014-11-03 | Discharge: 2014-11-03 | Disposition: A | Discharge status: Home / Self Care | Payer: Medicare Other | Source: Ambulatory Visit | Attending: Gastroenterology | Admitting: Gastroenterology

## 2014-11-07 ENCOUNTER — Ambulatory Visit (HOSPITAL_COMMUNITY): Payer: Medicare Other | Admitting: Anesthesiology

## 2014-11-07 ENCOUNTER — Ambulatory Visit: Admit: 2014-11-07 | Payer: Medicare Other | Admitting: Gastroenterology

## 2014-11-07 ENCOUNTER — Encounter (HOSPITAL_COMMUNITY): Payer: Self-pay | Admitting: *Deleted

## 2014-11-07 ENCOUNTER — Ambulatory Visit (HOSPITAL_COMMUNITY)
Admission: RE | Admit: 2014-11-07 | Discharge: 2014-11-07 | Disposition: A | Discharge status: Home / Self Care | Payer: Medicare Other | Source: Ambulatory Visit | Attending: Gastroenterology | Admitting: Gastroenterology

## 2014-11-07 ENCOUNTER — Encounter (HOSPITAL_COMMUNITY)
Admission: RE | Disposition: A | Discharge status: Home / Self Care | Payer: Self-pay | Source: Ambulatory Visit | Attending: Gastroenterology

## 2014-11-07 DIAGNOSIS — R1013 Epigastric pain: Secondary | ICD-10-CM | POA: Diagnosis not present

## 2014-11-07 DIAGNOSIS — K633 Ulcer of intestine: Secondary | ICD-10-CM | POA: Diagnosis not present

## 2014-11-07 DIAGNOSIS — I10 Essential (primary) hypertension: Secondary | ICD-10-CM | POA: Insufficient documentation

## 2014-11-07 DIAGNOSIS — K2970 Gastritis, unspecified, without bleeding: Secondary | ICD-10-CM | POA: Insufficient documentation

## 2014-11-07 DIAGNOSIS — E039 Hypothyroidism, unspecified: Secondary | ICD-10-CM | POA: Diagnosis not present

## 2014-11-07 DIAGNOSIS — K921 Melena: Secondary | ICD-10-CM

## 2014-11-07 DIAGNOSIS — R197 Diarrhea, unspecified: Secondary | ICD-10-CM | POA: Diagnosis not present

## 2014-11-07 DIAGNOSIS — K449 Diaphragmatic hernia without obstruction or gangrene: Secondary | ICD-10-CM | POA: Diagnosis not present

## 2014-11-07 DIAGNOSIS — Z79899 Other long term (current) drug therapy: Secondary | ICD-10-CM | POA: Insufficient documentation

## 2014-11-07 HISTORY — PX: ENTEROSCOPY: SHX5533

## 2014-11-07 SURGERY — COLONOSCOPY WITH PROPOFOL
Anesthesia: Monitor Anesthesia Care

## 2014-11-07 SURGERY — ENTEROSCOPY
Anesthesia: Monitor Anesthesia Care

## 2014-11-07 MED ORDER — GLUCAGON HCL RDNA (DIAGNOSTIC) 1 MG IJ SOLR
INTRAMUSCULAR | Status: AC
  Filled 2014-11-07: qty 1

## 2014-11-07 MED ORDER — FENTANYL CITRATE (PF) 100 MCG/2ML IJ SOLN: 25.0000 ug | INTRAMUSCULAR | Status: DC | PRN

## 2014-11-07 MED ORDER — LIDOCAINE VISCOUS 2 % MT SOLN
OROMUCOSAL | Status: AC
  Filled 2014-11-07: qty 15

## 2014-11-07 MED ORDER — MIDAZOLAM HCL 2 MG/2ML IJ SOLN
INTRAMUSCULAR | Status: AC
  Filled 2014-11-07: qty 4

## 2014-11-07 MED ORDER — FENTANYL CITRATE (PF) 100 MCG/2ML IJ SOLN
25.0000 ug | INTRAMUSCULAR | Status: AC
  Administered 2014-11-07 (×2): 25 ug via INTRAVENOUS

## 2014-11-07 MED ORDER — MIDAZOLAM HCL 2 MG/2ML IJ SOLN
INTRAMUSCULAR | Status: AC
  Filled 2014-11-07: qty 2

## 2014-11-07 MED ORDER — MIDAZOLAM HCL 5 MG/5ML IJ SOLN
INTRAMUSCULAR | Status: DC | PRN
Start: 2014-11-07 — End: 2014-11-07
  Administered 2014-11-07: 2 mg via INTRAVENOUS

## 2014-11-07 MED ORDER — LACTATED RINGERS IV SOLN
INTRAVENOUS | Status: DC
  Administered 2014-11-07: 08:00:00 via INTRAVENOUS

## 2014-11-07 MED ORDER — GLYCOPYRROLATE 0.2 MG/ML IJ SOLN
0.2000 mg | Freq: Once | INTRAMUSCULAR | Status: AC
  Administered 2014-11-07: 0.2 mg via INTRAVENOUS

## 2014-11-07 MED ORDER — PROPOFOL 10 MG/ML IV BOLUS
INTRAVENOUS | Status: AC
  Filled 2014-11-07: qty 20

## 2014-11-07 MED ORDER — ONDANSETRON HCL 4 MG/2ML IJ SOLN
4.0000 mg | Freq: Once | INTRAMUSCULAR | Status: AC
  Administered 2014-11-07: 4 mg via INTRAVENOUS

## 2014-11-07 MED ORDER — PROPOFOL INFUSION 10 MG/ML OPTIME
INTRAVENOUS | Status: DC | PRN
  Administered 2014-11-07: 100 ug/kg/min via INTRAVENOUS

## 2014-11-07 MED ORDER — GLYCOPYRROLATE 0.2 MG/ML IJ SOLN
INTRAMUSCULAR | Status: AC
  Filled 2014-11-07: qty 1

## 2014-11-07 MED ORDER — FENTANYL CITRATE (PF) 100 MCG/2ML IJ SOLN
INTRAMUSCULAR | Status: AC
  Filled 2014-11-07: qty 2

## 2014-11-07 MED ORDER — GLUCAGON HCL RDNA (DIAGNOSTIC) 1 MG IJ SOLR
INTRAMUSCULAR | Status: DC | PRN
  Administered 2014-11-07: 1 mg via INTRAVENOUS

## 2014-11-07 MED ORDER — ONDANSETRON HCL 4 MG/2ML IJ SOLN
INTRAMUSCULAR | Status: AC
  Filled 2014-11-07: qty 2

## 2014-11-07 MED ORDER — MIDAZOLAM HCL 2 MG/2ML IJ SOLN
1.0000 mg | INTRAMUSCULAR | Status: DC | PRN
  Administered 2014-11-07 (×2): 1 mg via INTRAVENOUS

## 2014-11-07 MED ORDER — ONDANSETRON HCL 4 MG/2ML IJ SOLN: 4.0000 mg | Freq: Once | INTRAMUSCULAR | Status: DC | PRN

## 2014-11-07 MED ORDER — STERILE WATER FOR IRRIGATION IR SOLN
Status: DC | PRN
  Administered 2014-11-07: 1000 mL

## 2014-11-07 SURGICAL SUPPLY — 7 items
BLOCK BITE 60FR ADLT L/F BLUE (MISCELLANEOUS) ×3 IMPLANT
FORCEPS BIOP RAD 4 LRG CAP 4 (CUTTING FORCEPS) ×3 IMPLANT
FORMALIN 10 PREFIL 20ML (MISCELLANEOUS) ×3 IMPLANT
KIT ENDO PROCEDURE PEN (KITS) ×3 IMPLANT
MANIFOLD NEPTUNE II (INSTRUMENTS) ×3 IMPLANT
TUBING IRRIGATION ENDOGATOR (MISCELLANEOUS) ×3 IMPLANT
WATER STERILE IRR 1000ML POUR (IV SOLUTION) ×3 IMPLANT

## 2014-11-07 NOTE — Anesthesia Preprocedure Evaluation (Signed)
Anesthesia Evaluation  Patient identified by MRN, date of birth, ID band Patient awake    Reviewed: Allergy & Precautions, NPO status , Patient's Chart, lab work & pertinent test results  Airway Mallampati: II  TM Distance: >3 FB     Dental  (+) Teeth Intact, Implants   Pulmonary neg pulmonary ROS,    breath sounds clear to auscultation       Cardiovascular hypertension, Pt. on medications  Rhythm:Regular Rate:Normal     Neuro/Psych    GI/Hepatic GERD  Medicated and Poorly Controlled,  Endo/Other  Hypothyroidism   Renal/GU      Musculoskeletal  (+) Fibromyalgia -  Abdominal   Peds  Hematology   Anesthesia Other Findings   Reproductive/Obstetrics                             Anesthesia Physical Anesthesia Plan  ASA: III  Anesthesia Plan: MAC   Post-op Pain Management:    Induction: Intravenous  Airway Management Planned: Simple Face Mask  Additional Equipment:   Intra-op Plan:   Post-operative Plan:   Informed Consent: I have reviewed the patients History and Physical, chart, labs and discussed the procedure including the risks, benefits and alternatives for the proposed anesthesia with the patient or authorized representative who has indicated his/her understanding and acceptance.     Plan Discussed with:   Anesthesia Plan Comments:         Anesthesia Quick Evaluation

## 2014-11-07 NOTE — Anesthesia Postprocedure Evaluation (Signed)
  Anesthesia Post-op Note  Patient: Bonnie Reynolds  Procedure(s) Performed: Procedure(s) with comments: ENTEROSCOPY WITH PROPOFOL (N/A) - with PROPOFOL 730  Patient Location: PACU  Anesthesia Type:MAC  Level of Consciousness: awake, alert , oriented and patient cooperative  Airway and Oxygen Therapy: Patient Spontanous Breathing  Post-op Pain: none  Post-op Assessment: Post-op Vital signs reviewed, Patient's Cardiovascular Status Stable, Respiratory Function Stable, Patent Airway, No signs of Nausea or vomiting and Pain level controlled              Post-op Vital Signs: Reviewed and stable  Last Vitals:  Filed Vitals:   11/07/14 0702  Temp: 36.4 C    Complications: No apparent anesthesia complications

## 2014-11-07 NOTE — Transfer of Care (Signed)
Immediate Anesthesia Transfer of Care Note  Patient: Bonnie Reynolds  Procedure(s) Performed: Procedure(s) with comments: ENTEROSCOPY WITH PROPOFOL (N/A) - with PROPOFOL 730  Patient Location: PACU  Anesthesia Type:MAC  Level of Consciousness: awake and patient cooperative  Airway & Oxygen Therapy: Patient Spontanous Breathing  Post-op Assessment: Report given to RN, Post -op Vital signs reviewed and stable and Patient moving all extremities  Post vital signs: Reviewed and stable  Last Vitals:  Filed Vitals:   11/07/14 0702  Temp: 36.4 C    Complications: No apparent anesthesia complications

## 2014-11-07 NOTE — Discharge Instructions (Signed)
I DID NOT SEE YOUR ULCERS. THEY ARE LOCATED BEYOND THE EXTENT OF THE SCOPE. YOU HAD small ulcerATED NODULES in your stomach AND  MODERATE SIZE HIATAL HERNIA. I BIOPSIED YOUR STOMACH.   STRICTLY AVOID ASPIRIN, BC/GOODY POWDERS, IBUPROFEN/MOTRIN, OR NAPROXEN/ALEVE.  USE TYLENOL AS NEEDED FOR PAIN.  CONTINUE OMEPRAZOLE.  TAKE 30 MINUTES PRIOR TO YOUR MEALS TWICE DAILY FOR 2 WEEKS.  FOLLOW A LOW FAT DIET. SEE INFO BELOW.   YOUR BIOPSY RESULTS WILL BE AVAILABLE IN MY CHART AFTER SEP 15 AND MY OFFICE WILL CONTACT YOU IN 10-14 DAYS WITH YOUR RESULTS.   YOUR OPTIONS FOR MANAGEMENT OF YOUR ULCERS AND SYMPTOMS ARE: 1. START MESALAMINE AND GO [facility] IF YOU DO NOT GET BETTER, 2/ NO THERAPY AND GO [facility] FOR A CONSULT AND PROCEDURE TO TRY TO REACH THE ULCERS/  FOLLOW UP IN 4 MOS.     UPPER ENDOSCOPY AFTER CARE Read the instructions outlined below and refer to this sheet in the next week. These discharge instructions provide you with general information on caring for yourself after you leave the hospital. While your treatment has been planned according to the most current medical practices available, unavoidable complications occasionally occur. If you have any problems or questions after discharge, call DR. Praveen Coia, [phone].  ACTIVITY  You may resume your regular activity, but move at a slower pace for the next 24 hours.   Take frequent rest periods for the next 24 hours.   Walking will help get rid of the air and reduce the bloated feeling in your belly (abdomen).   No driving for 24 hours (because of the medicine (anesthesia) used during the test).   You may shower.   Do not sign any important legal documents or operate any machinery for 24 hours (because of the anesthesia used during the test).    NUTRITION  Drink plenty of fluids.   You may resume your normal diet as instructed by your doctor.   Begin with a light meal and progress to your normal diet. Heavy or fried foods  are harder to digest and may make you feel sick to your stomach (nauseated).   Avoid alcoholic beverages for 24 hours or as instructed.    MEDICATIONS  You may resume your normal medications.   WHAT YOU CAN EXPECT TODAY  Some feelings of bloating in the abdomen.   Passage of more gas than usual.    IF YOU HAD A BIOPSY TAKEN DURING THE UPPER ENDOSCOPY:    Eat a soft diet IF YOU HAVE NAUSEA, BLOATING, ABDOMINAL PAIN, OR VOMITING.    FINDING OUT THE RESULTS OF YOUR TEST Not all test results are available during your visit. DR. Darrick Penna WILL CALL YOU WITHIN 14 DAYS OF YOUR PROCEDUE WITH YOUR RESULTS. Do not assume everything is normal if you have not heard from DR. Romaldo Saville IN ONE WEEK, CALL HER OFFICE AT [phone].  SEEK IMMEDIATE MEDICAL ATTENTION AND CALL THE OFFICE: [phone] IF:  You have more than a spotting of blood in your stool.   Your belly is swollen (abdominal distention).   You are nauseated or vomiting.   You have a temperature over 101F.   You have abdominal pain or discomfort that is severe or gets worse throughout the day.   Ulcer Disease (Peptic Ulcer, Gastric Ulcer, Duodenal Ulcer) You have an ulcer. This may be in your stomach (gastric ulcer) or in the first part of your small bowel, the duodenum (duodenal ulcer). An ulcer is a break in  the lining of the stomach or duodenum. The ulcer causes erosion into the deeper tissue.  CAUSES The stomach has a lining to protect itself from the acid that digests food. The lining can be damaged in two main ways:  The Helico Pylori bacteria (H. Pyolori) can infect the lining of the stomach and cause ulcers.   Nonsteroidal, anti-inflammatory medications (NSAIDS) can cause gastric ulcerations.   Smoking tobacco can increase the acid in the stomach. This can lead to ulcers, and will impair healing of ulcers.   Other factors, such as alcohol use and stress may contribute to ulcer formation.  SYMPTOMS The  problems (symptoms) of ulcer disease are usually a burning or gnawing of the mid-upper belly (abdomen). This is often worse on an empty stomach and may get better with food. This may be associated with feeling sick to your stomach (nausea), bloating, and vomiting.  HOME CARE INSTRUCTIONS Continue regular work and usual activities unless advised otherwise by your caregiver.  Avoid tobacco, alcohol, and caffeine. Tobacco use will decrease and slow the rates of healing.   Avoid foods that seem to aggravate or cause discomfort.    Low-Fat Diet BREADS, CEREALS, PASTA, RICE, DRIED PEAS, AND BEANS These products are high in carbohydrates and most are low in fat. Therefore, they can be increased in the diet as substitutes for fatty foods. They too, however, contain calories and should not be eaten in excess. Cereals can be eaten for snacks as well as for breakfast.   FRUITS AND VEGETABLES It is good to eat fruits and vegetables. Besides being sources of fiber, both are rich in vitamins and some minerals. They help you get the daily allowances of these nutrients. Fruits and vegetables can be used for snacks and desserts.  MEATS Limit lean meat, chicken, [place], and fish to no more than 6 ounces per day. Beef, Pork, and Lamb Use lean cuts of beef, pork, and lamb. Lean cuts include:  Extra-lean ground beef.  Arm roast.  Sirloin tip.  Center-cut ham.  Round steak.  Loin chops.  Rump roast.  Tenderloin.  Trim all fat off the outside of meats before cooking. It is not necessary to severely decrease the intake of red meat, but lean choices should be made. Lean meat is rich in protein and contains a highly absorbable form of iron. Premenopausal women, in particular, should avoid reducing lean red meat because this could increase the risk for low red blood cells (iron-deficiency anemia).  Chicken and [place] These are good sources of protein. The fat of poultry can be reduced by removing the skin and  underlying fat layers before cooking. Chicken and [place] can be substituted for lean red meat in the diet. Poultry should not be fried or covered with high-fat sauces. Fish and Shellfish Fish is a good source of protein. Shellfish contain cholesterol, but they usually are low in saturated fatty acids. The preparation of fish is important. Like chicken and [place], they should not be fried or covered with high-fat sauces. EGGS Egg whites contain no fat or cholesterol. They can be eaten often. Try 1 to 2 egg whites instead of whole eggs in recipes or use egg substitutes that do not contain yolk. MILK AND DAIRY PRODUCTS Use skim or 1% milk instead of 2% or whole milk. Decrease whole milk, natural, and processed cheeses. Use nonfat or low-fat (2%) cottage cheese or low-fat cheeses made from vegetable oils. Choose nonfat or low-fat (1 to 2%) yogurt. Experiment with evaporated skim milk  in recipes that call for heavy cream. Substitute low-fat yogurt or low-fat cottage cheese for sour cream in dips and salad dressings. Have at least 2 servings of low-fat dairy products, such as 2 glasses of skim (or 1%) milk each day to help get your daily calcium intake. FATS AND OILS Reduce the total intake of fats, especially saturated fat. Butterfat, lard, and beef fats are high in saturated fat and cholesterol. These should be avoided as much as possible. Vegetable fats do not contain cholesterol, but certain vegetable fats, such as coconut oil, palm oil, and palm kernel oil are very high in saturated fats. These should be limited. These fats are often used in bakery goods, processed foods, popcorn, oils, and nondairy creamers. Vegetable shortenings and some peanut butters contain hydrogenated oils, which are also saturated fats. Read the labels on these foods and check for saturated vegetable oils. Unsaturated vegetable oils and fats do not raise blood cholesterol. However, they should be limited because they are fats and are  high in calories. Total fat should still be limited to 30% of your daily caloric intake. Desirable liquid vegetable oils are corn oil, cottonseed oil, olive oil, canola oil, safflower oil, soybean oil, and sunflower oil. Peanut oil is not as good, but small amounts are acceptable. Buy a heart-healthy tub margarine that has no partially hydrogenated oils in the ingredients. Mayonnaise and salad dressings often are made from unsaturated fats, but they should also be limited because of their high calorie and fat content. Seeds, nuts, peanut butter, olives, and avocados are high in fat, but the fat is mainly the unsaturated type. These foods should be limited mainly to avoid excess calories and fat. OTHER EATING TIPS Snacks  Most sweets should be limited as snacks. They tend to be rich in calories and fats, and their caloric content outweighs their nutritional value. Some good choices in snacks are graham crackers, melba toast, soda crackers, bagels (no egg), English muffins, fruits, and vegetables. These snacks are preferable to snack crackers, [place] fries, TORTILLA CHIPS, and POTATO chips. Popcorn should be air-popped or cooked in small amounts of liquid vegetable oil. Desserts Eat fruit, low-fat yogurt, and fruit ices instead of pastries, cake, and cookies. Sherbet, angel food cake, gelatin dessert, frozen low-fat yogurt, or other frozen products that do not contain saturated fat (pure fruit juice bars, frozen ice pops) are also acceptable.  COOKING METHODS Choose those methods that use little or no fat. They include: Poaching.  Braising.  Steaming.  Grilling.  Baking.  Stir-frying.  Broiling.  Microwaving.  Foods can be cooked in a nonstick pan without added fat, or use a nonfat cooking spray in regular cookware. Limit fried foods and avoid frying in saturated fat. Add moisture to lean meats by using water, broth, cooking wines, and other nonfat or low-fat sauces along with the cooking methods  mentioned above. Soups and stews should be chilled after cooking. The fat that forms on top after a few hours in the refrigerator should be skimmed off. When preparing meals, avoid using excess salt. Salt can contribute to raising blood pressure in some people.  EATING AWAY FROM HOME Order entres, potatoes, and vegetables without sauces or butter. When meat exceeds the size of a deck of cards (3 to 4 ounces), the rest can be taken home for another meal. Choose vegetable or fruit salads and ask for low-calorie salad dressings to be served on the side. Use dressings sparingly. Limit high-fat toppings, such as bacon, crumbled eggs,  cheese, sunflower seeds, and olives. Ask for heart-healthy tub margarine instead of butter.

## 2014-11-07 NOTE — H&P (Signed)
Primary Care Physician:  Terance Hart, PA-C Primary Gastroenterologist:  Dr. Darrick Penna  Pre-Procedure History & Physical: HPI:  Bonnie Reynolds is a 69 y.o. female here for GI BLEED/ANEMIA-SMALL BOWEL ULCER.  Past Medical History  Diagnosis Date  . Hypothyroidism   . Neuropathy   . Hypertension   . Fibromyalgia   . Neuropathy 2014    Past Surgical History  Procedure Laterality Date  . Abdominal hysterectomy      cancer  . Cholecystectomy    . Appendectomy    . Tonsillectomy    . Back surgery      X 2, cervical and thoracic  . Flexible sigmoidoscopy  04/2013    Dr. Samuella Cota, Propofol: exam to 60cm, abnormal mucosa with edema, erythema and ulceration proximal to 30 cm consistent with ischemic bowel disease. Biopsies taken, we didn't ever receive those records however. Sigmoid diverticulosis also seen.  . Colonoscopy  08/2006    Dr. Gala Romney Shiflett: Normal colon except for diverticulosis and 2 rectal polyps, one was tubular adenoma.  . Colonoscopy  2004    Dr. Riley Lam Shiflett: unremarkable  . Esophagogastroduodenoscopy  02/2013    Dr. Samuella Cota: small hiatal hernia and mild gastritis. Negative for H pylori.  . Colonoscopy with propofol N/A 09/26/2014    Procedure: COLONOSCOPY WITH PROPOFOL;  Surgeon: West Bali, MD;  Location: [facility];  Service: Endoscopy;  Laterality: N/A;  cecum time in=1017  time out=1028  total withdrawal time=33minutes procedure 1  . Esophagogastroduodenoscopy (egd) with propofol N/A 09/26/2014    Procedure: ESOPHAGOGASTRODUODENOSCOPY (EGD) WITH PROPOFOL;  Surgeon: West Bali, MD;  Location: [facility];  Service: Endoscopy;  Laterality: N/A;  . Esophageal biopsy  09/26/2014    Procedure: BIOPSY;  Surgeon: West Bali, MD;  Location: [facility];  Service: Endoscopy;;  random colon, duodenal   . Givens capsule study N/A 10/09/2014    Procedure: GIVENS CAPSULE STUDY;  Surgeon: West Bali, MD;  Location: [facility] SU[facility];  Service: Endoscopy;  Laterality: N/A;  0700     Prior to Admission medications   Medication Sig Start Date End Date Taking? Authorizing Provider  Fish Oil-Cholecalciferol (FISH OIL + D3 PO) Take 1 capsule by mouth daily.   Yes Historical Provider, MD  hyoscyamine (LEVSIN SL) 0.125 MG SL tablet 1 sl AT 8 AM 12N AND 4 PM. No more than 8 tabs a day. 10/19/14  Yes West Bali, MD  levothyroxine (SYNTHROID, LEVOTHROID) 125 MCG tablet Take 125 mcg by mouth daily before breakfast.   Yes Historical Provider, MD  lisinopril-hydrochlorothiazide (PRINZIDE,ZESTORETIC) 10-12.5 MG per tablet Take 1 tablet by mouth daily.   Yes Historical Provider, MD  meloxicam (MOBIC) 7.5 MG tablet 1-2 PO DAILY TO PREVENT BACK PAIN 10/13/14  Yes West Bali, MD  omeprazole (PRILOSEC) 20 MG capsule 1 PO 30 mins prior to breakfast and supper 09/26/14  Yes West Bali, MD  promethazine (PHENERGAN) 12.5 MG tablet 1/2 TO 2 po q6 h prn nausea or vomiting 10/13/14  Yes West Bali, MD  vitamin B-12 (CYANOCOBALAMIN) 1000 MCG tablet Take 1,000 mcg by mouth daily.   Yes Historical Provider, MD  VITAMIN D, CHOLECALCIFEROL, PO Take 1,000 Units by mouth daily.    Yes Historical Provider, MD    Allergies as of 11/01/2014 - Review Complete 10/19/2014  Allergen Reaction Noted  . Codeine Other (See Comments) 06/21/2014  . Prednisone Swelling 06/21/2014    Family History  Problem Relation Age of Onset  . Colitis Brother  1/2 brother  . Colon cancer Neg Hx   . Colon polyps Neg Hx   . Neuropathy Mother     deceased age 69  . CVA Father     deceased age 17    Social History   Social History  . Marital Status: Married    Spouse Name: N/A  . Number of Children: 2  . Years of Education: N/A   Occupational History  . hair dresser    Social History Main Topics  . Smoking status: Never Smoker   . Smokeless tobacco: Not on file  . Alcohol Use: No  . Drug Use: No  . Sexual Activity: Not on file   Other Topics Concern  . Not on file   Social History  Narrative   TWO GRANDKIDS-AGE 45(TWINS) LIVE WITH HER. DAUGHTER WORKING AS A PHLEBOTOMIST.     Review of Systems: See HPI, otherwise negative ROS   Physical Exam: There were no vitals taken for this visit. General:   Alert,  pleasant and cooperative in NAD Head:  Normocephalic and atraumatic. Neck:  Supple; Lungs:  Clear throughout to auscultation.    Heart:  Regular rate and rhythm. Abdomen:  Soft, nontender and nondistended. Normal bowel sounds, without guarding, and without rebound.   Neurologic:  Alert and  oriented x4;  grossly normal neurologically.  Impression/Plan:     GI BLEED/ANEMIA-SMALL BOWEL ULCER  PLAN: Enteroscopy/biopsy today

## 2014-11-08 ENCOUNTER — Telehealth: Payer: Self-pay

## 2014-11-08 ENCOUNTER — Encounter (HOSPITAL_COMMUNITY): Payer: Self-pay | Admitting: Gastroenterology

## 2014-11-08 NOTE — Telephone Encounter (Signed)
Pt's husband called to discuss the plan from pt's results yesterday. ( Pt had a friend with her yesterday) He said the pt was very sick vomiting last night and said she just does not see how she can go much longer.  I read the recommendations to him about the Mesalamine and [facility] referral or just the [facility] referral. He asked if Dr. Darrick Penna could call after 1:00 today and speak to the pt.  She went in to work to see a couple of clients.  She would like to discuss the info and options and discuss the medication and how quickly she could get in at [facility].  Please call pt if possible after 1:00 today at [phone].

## 2014-11-08 NOTE — Op Note (Addendum)
[facility] [address] [place], [address]   ENTEROSCOPY PROCEDURE REPORT     EXAM DATE: 11/07/2014  PATIENT NAME:      Bonnie Reynolds, Bonnie Reynolds           MR #:      [id number] BIRTHDATE:       Aug 10, 1945      VISIT #:     [id number] ATTENDING:     West Bali, MD     STATUS:     outpatient ASSISTANT: REFERRING MD:   Dub Mikes, PA-c ASA CLASS:  INDICATIONS:  The patient is a 69 yr old female here for an enteroscopy procedure due to epigastric pain and FEDA, DIARRHEA AND ULCERS-WORKUP INCLUDES AUG 2016-Ferritin 9, EGD/TCS NL DUODENAL/COLON Bx.  PT RARELY USES ALEVE.  GIVENS CAPSULE-SB PASSAGE TIME: 2H70m RESULTS: Mutliple small bowel ulcers starting 1:52 and extending to 2:39.Marland Kitchen PROCEDURE PERFORMED:     Small bowel enteroscopy with biopsy  MEDICATIONS:     Monitored anesthesia care and Glucagon 1 mg IV   CONSENT: The patient understands the risks and benefits of the procedure and understands that these risks include, but are not limited to: sedation, allergic reaction, infection, perforation and/or bleeding. Alternative means of evaluation and treatment include, among others: physical exam, x-rays, and/or surgical intervention. The patient elects to proceed with this endoscopic procedure.  DESCRIPTION OF PROCEDURE: During intra-op preparation period all mechanical & medical equipment was checked for proper function. Hand hygiene and appropriate measures for infection prevention was taken. After the risks, benefits and alternatives of the procedure were thoroughly explained, Informed consent was verified, confirmed and timeout was successfully executed by the treatment team. The    endoscope was introduced through the mouth and advanced to the proximal jejunum jejunum. The prep was The overall prep quality was good.. The instrument was then slowly withdrawn while examining the mucosa circumferentially. The scope was then completely withdrawn from the patient and  the procedure terminated. The pulse, BP, and O2 saturation were monitored and documented by the physician and the nursing staff throughout the entire procedure.  The patient was cared for as planned according to standard protocol, then discharged to recovery in stable condition and with appropriate post procedure care. Estimated blood loss is zero unless otherwise noted in this procedure report.  ESOPHAGUS: The mucosa of the esophagus appeared normal.   STOMACH: MOEDERATE SIZE HIATAL HERNIA. TWO ULCERATED GASTRIC NODULES SEEN AND BIOPSIED VIA COLD FORCEPS.   DUODENUM: The duodenal mucosa showed no abnormalities in the entire duodenum.  JEJUNUM: The exam showed no abnormalities in the jejunum.    ADVERSE EVENTS:      There were no immediate complications.  IMPRESSIONS:     1.  NO SOURCE FOR SMALL BOWEL ULCERS IDENTIFIED 2.  TWO ULCERATED GASTRIC NODULES  RECOMMENDATIONS:     STRICTLY AVOID ASPIRIN, BC/GOODY POWDERS, IBUPROFEN/MOTRIN, OR NAPROXEN/ALEVE. USE TYLENOL AS NEEDED FOR PAIN. CONTINUE OMEPRAZOLE.  TAKE 30 MINUTES PRIOR TO YOUR MEALS TWICE DAILY. FOLLOW A LOW FAT DIET. AWAIT BIOPSY RESULTS. OPTIONS FOR MANAGEMENT OF ULCERS AND SYMPTOMS ARE: 1.  START MESALAMINE AND GO [facility] IF YOU DO NOT GET BETTER, 2.  NO THERAPY AND GO [facility] FOR A CONSULT AND PROCEDURE TO TRY TO REACH THE ULCERS. FOLLOW UP IN 4 MOS. RECALL:   West Bali, MD eSigned:  West Bali, MD 11/08/2014 10:47 AMRevised: 11/08/2014 10:47 AMcc:     PATIENT NAME:  Bonnie Reynolds, Bonnie Reynolds MR#: [id number]

## 2014-11-09 MED ORDER — MESALAMINE ER 500 MG PO CPCR: 1000.0000 mg | ORAL_CAPSULE | Freq: Four times a day (QID) | ORAL | Status: DC

## 2014-11-09 NOTE — Telephone Encounter (Signed)
I spoke with Bonnie Reynolds and she will place some samples at the front desk for her.  Also instructed the patient that she needed to call her pharmacy to check on the price of her medication.

## 2014-11-09 NOTE — Telephone Encounter (Signed)
Patient's husband called in stating the Pentasa was $1400 and that was to expensive.  I told him that we will give her an application to apply for assistance with her medication.  I told him that we would need to get the application along with supporting documents back ASAP.  He said that he would be by tomorrow to pick up samples/application.

## 2014-11-09 NOTE — Addendum Note (Signed)
Addended by: Nira Retort on: 11/09/2014 11:09 AM   Modules accepted: Orders

## 2014-11-09 NOTE — Telephone Encounter (Signed)
I sent in Pentasa, as this is released throughout the small bowel. Take 2 capsules four times a day. Creatinine normal 0.93.    Lab Results  Component Value Date   CREATININE 0.93 09/25/2014   BUN 20 09/25/2014   NA 139 09/25/2014   K 4.8 09/25/2014   CL 104 09/25/2014   CO2 28 09/25/2014

## 2014-11-09 NOTE — Telephone Encounter (Signed)
Samples, patient assistance forms and instructions are at the front desk for the pt to pick up.

## 2014-11-09 NOTE — Telephone Encounter (Signed)
Patient prefers a Monday appointment with [facility] or maybe even Tuesday.  Please call her at [phone] or her husband(Virgil) at [phone]

## 2014-11-09 NOTE — Telephone Encounter (Signed)
Bonnie Reynolds, we have Pentasa, Delzichol and Apriso samples, because the patient doesn't have a drug plan with her current insurance plan.  Routing to [place] for advice in Dr. Darrick Penna absence.

## 2014-11-14 ENCOUNTER — Telehealth: Payer: Self-pay

## 2014-11-14 NOTE — Telephone Encounter (Signed)
Pt called and states that she can't cough and that she is having pain in her abdomen and that the pain is an 8 out 10.  States that when she goes to use the restroom she feels pressure.  Pt states that her procedure was a few weeks ago. Please advise.

## 2014-11-14 NOTE — Telephone Encounter (Signed)
I called pt and she said she has not felt right since the last procedure. She has cramping below her unbilicus and it hurts so bad when she takes a step.  She has hemorrhoids and they feel like they come out some when she has pressure.  She has pain in her abdomen when she coughs. She has not had any bleeding from the hemorrhoids. She said she just wants to bet better. Please advise!

## 2014-11-14 NOTE — Telephone Encounter (Signed)
SEE TC SEP 20.

## 2014-11-15 ENCOUNTER — Other Ambulatory Visit: Payer: Self-pay

## 2014-11-15 DIAGNOSIS — K633 Ulcer of intestine: Secondary | ICD-10-CM

## 2014-11-15 NOTE — Telephone Encounter (Signed)
Called patient TO DISCUSS CONCERNS. BOTHERED BY SORE AFTER ENTEROSCOPY. RIGHT BELOW HER BELLY BUTTON. PT CRAMPS AND GOES BACK DOWN. HER RECTUM SHE HAS SEVERE PAIN. EVERY STEP HAVING PROBLEMS WITH PAIN IN ABDOMEN AND RECTUM WHEN SHE WALKS. USING PILLS UNDER HER TONGUE TO HELP WITH NAUSEA. HASN'T HEARD FROM [facility]. PT HAVING BUTT PAIN. NO Rx DRUG COVERAGE. RECOMMENDED PREP H SUPP/OINTMENT FOUR TIMES FOR 7 DAYS. WILL CHECK WITH [facility] REGARDING APPT. PT WILLING TO WAIT FOR APPT. OPTIONS FOR SMALL BOWEL ULCERS: 1. WAIT FOR TISSUE DIAGNOSIS FROM DBE OR TAKING PREDNISONE TO CLEAR UP SYMPTOMS WITHOUT A TISSUE DIAGNOSIS.  PT WILL RETURN WITH PAPERWORK FOR PENTASA ASSISTANCE. PT NEEDS TO PICKUP DISC FOR GIVENS STUDY.

## 2014-11-16 NOTE — Telephone Encounter (Signed)
Bonnie Reynolds in Endo is aware to have the disc for pt to pick up on Monday or Tuesday.

## 2014-11-16 NOTE — Telephone Encounter (Signed)
REVIEWED. APPT WITH [facility] NEXT THUR.

## 2014-11-20 NOTE — Telephone Encounter (Signed)
Patient came by and I helped her fill out her application and made copies of all her supporting documents.  I also made a note to the patient to gp by [facility] and call Shawna Orleans and she will come out to her car and give her a copy of her cd to take to [facility].

## 2014-11-28 ENCOUNTER — Telehealth: Payer: Self-pay | Admitting: Gastroenterology

## 2014-11-28 NOTE — Telephone Encounter (Signed)
Patient called to make sure her records had been sent to Dr Chilton Si in [place].  I faxed records to [phone] and spoke with Clayborne Dana and she said patient had OV last Thursday and still needed the records.

## 2014-11-29 NOTE — Telephone Encounter (Signed)
REVIEWED-NO ADDITIONAL RECOMMENDATIONS. 

## 2014-12-11 ENCOUNTER — Other Ambulatory Visit: Payer: Self-pay

## 2014-12-11 ENCOUNTER — Telehealth: Payer: Self-pay

## 2014-12-11 NOTE — Telephone Encounter (Signed)
[facility] FAX TO [phone]

## 2014-12-11 NOTE — Telephone Encounter (Signed)
[facility] is scheduled for 01/08/15. Pt has also done [facility] labs but they are not in their system yet, so I dont have those results.

## 2014-12-11 NOTE — Telephone Encounter (Signed)
Pt is calling to see if [facility]. Please advise

## 2014-12-13 ENCOUNTER — Telehealth: Payer: Self-pay | Admitting: Gastroenterology

## 2014-12-13 NOTE — Telephone Encounter (Signed)
REVIEWED-NO ADDITIONAL RECOMMENDATIONS. 

## 2014-12-13 NOTE — Telephone Encounter (Signed)
[phone]  PLEASE CALL PATIENT.  SHE IS INQUIRING ABOUT GETTING HER RESULTS FROM [facility], IT HAS BEEN 2 WEEKS AND SHE IS WORRIED.

## 2014-12-13 NOTE — Telephone Encounter (Signed)
Please see phone note of 12/11/2014.

## 2014-12-13 NOTE — Telephone Encounter (Signed)
REVIEWED RECORDS FROM DR. GREEN. PENDING GES. FELT DBE NOT INDICATED. ONE BAD SPELL LAST WEEK. HAD PROMETHEUS BLOOD DRAWN. OPV W/ SLF IN DEC 2016. STATES SHE LOST 35 LBS. GETS WEAK AND DIZZY. NO ENERGY. PT INSTRUCTED TO DRINK BOOST, ENSURE, OR CIB WITH ALMOND MILK OR SOY MILK TO PREVENT ADDITIONAL WEIGHT LOSS. PHENERGAN AND LEVSIN HELPS WITH CRAMPS. SHAKING AND CRYING IN BATHROOM  AND SX MAY LAST FOR 2-3 DAYS OF WATERY DIARRHEA, CLEAR STUFF GUSHES OUT.  PT NEEDS PH# FOR DR. Thomasene LotGREEN'S OFC AND WILL OBTAIN RESULTS FROM PROMETHEUS LAB IN ONE WEEK.

## 2015-02-13 ENCOUNTER — Encounter: Payer: Self-pay | Admitting: Gastroenterology

## 2017-04-28 IMAGING — RF DG ESOPHAGUS
8 of 13 series · 13 of 24 positions shown · non-contrast
Comparison: None.

CLINICAL DATA: Dysphasia pharyngo esophageal phase

EXAM:
ESOPHOGRAM / BARIUM SWALLOW / BARIUM TABLET STUDY
TECHNIQUE: Combined double contrast and single contrast examination performed
using effervescent crystals, thick barium liquid, and thin barium
liquid. The patient was observed with fluoroscopy swallowing a 13 mm
barium sulphate tablet.
FLUOROSCOPY TIME:  Radiation Exposure Index (as provided by the
fluoroscopic device):
If the device does not provide the exposure index:
Fluoroscopy Time:  1 minutes 48 seconds
Number of Acquired Images:

[Series 1: run · 6 of 26 slices shown (1 of 8)]
[im 1/26]
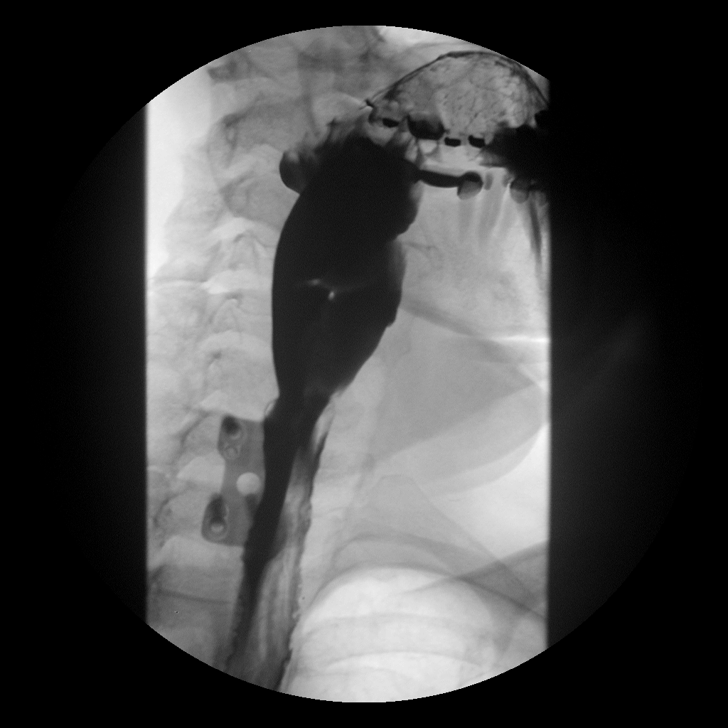
[im 5/26]
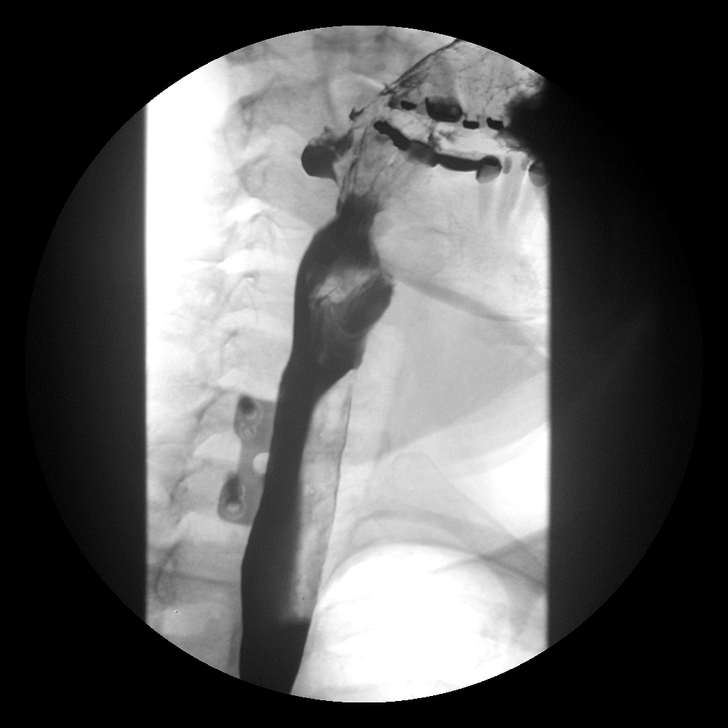
[im 10/26]
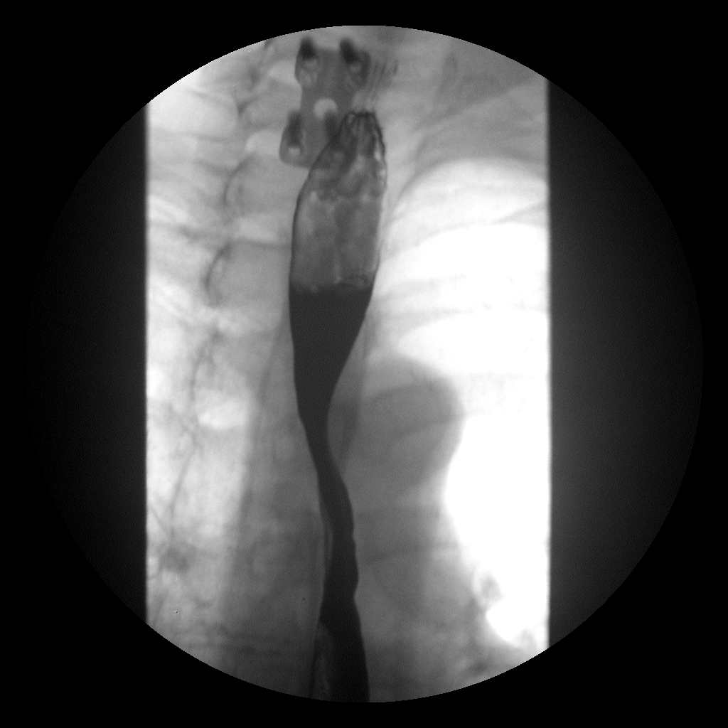
[im 14/26]
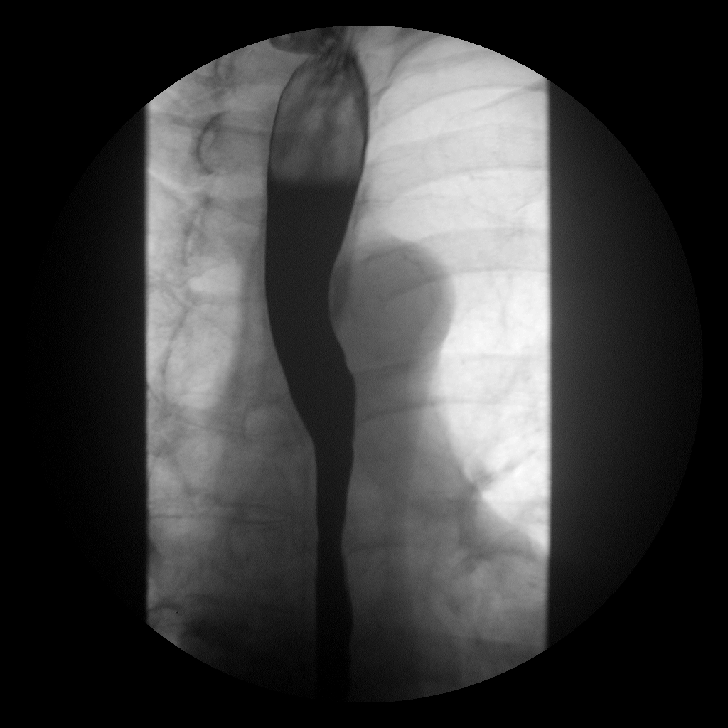
[im 19/26]
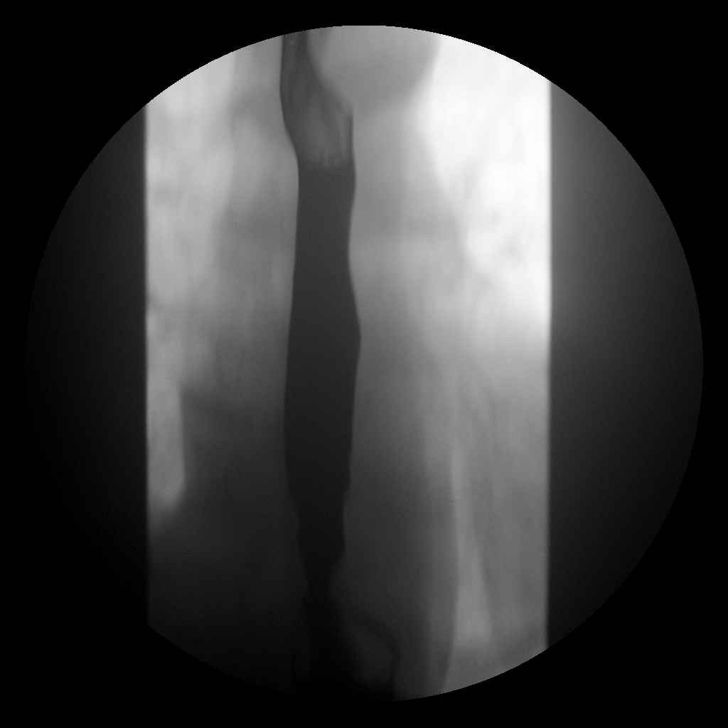
[im 23/26]
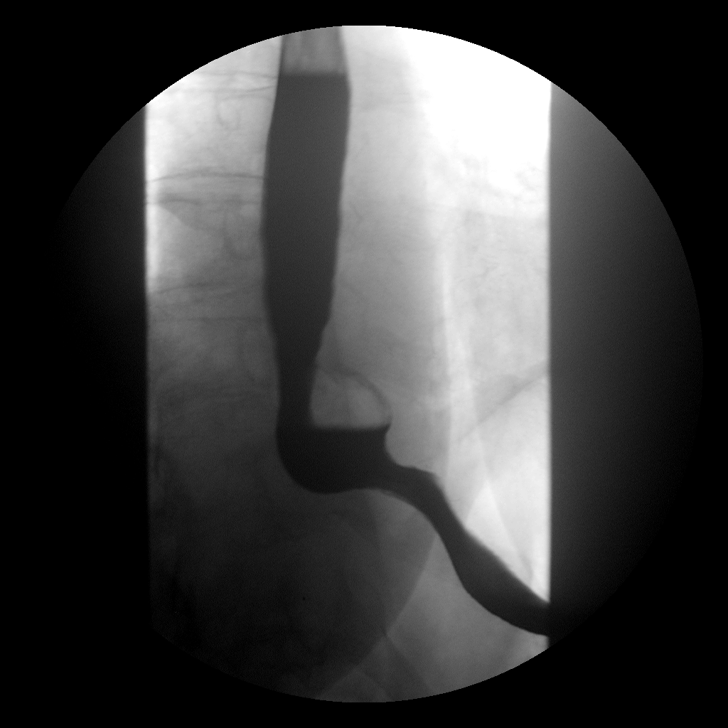

[Series 2: run · 1 of 1 slices shown (2 of 8)]
[im 1/1]
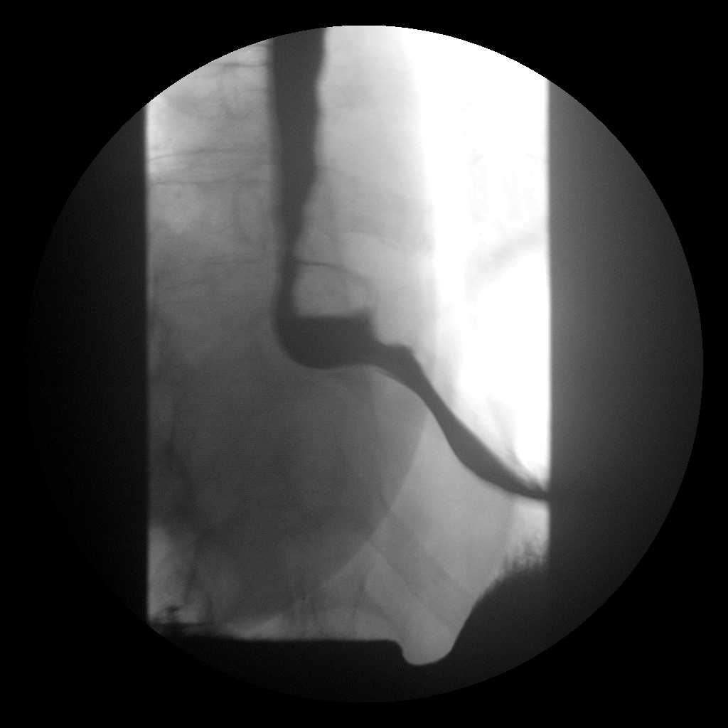

[Series 3: run · 1 of 1 slices shown (3 of 8)]
[im 1/1]
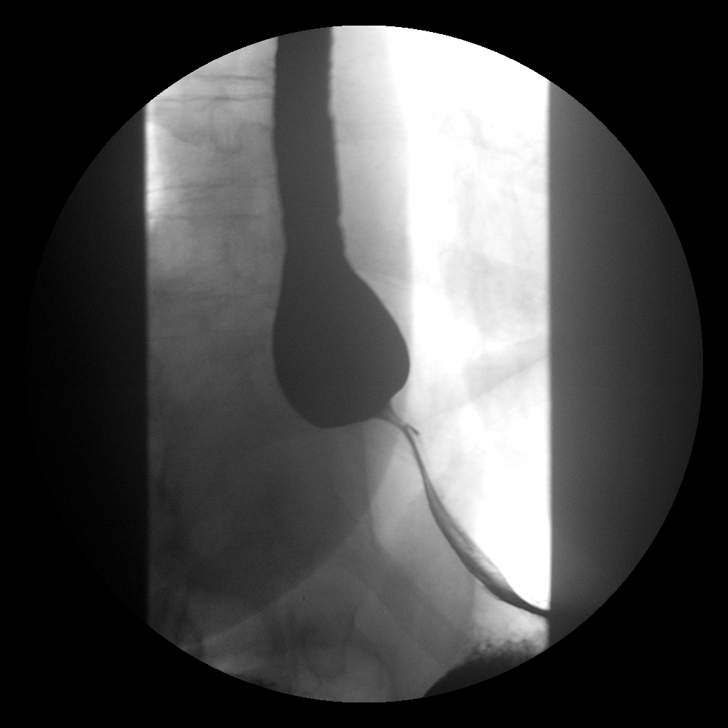

[Series 5: run · 1 of 1 slices shown (4 of 8)]
[im 1/1]
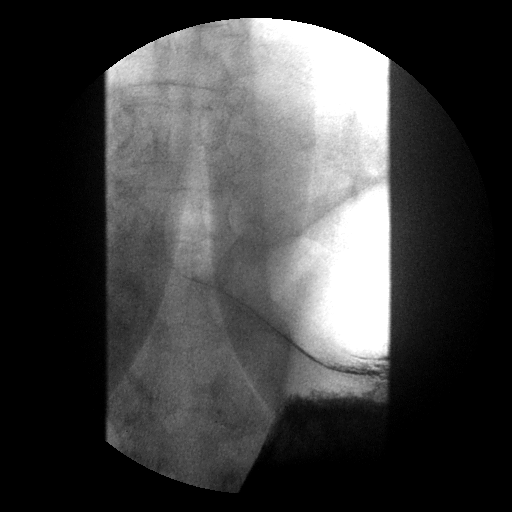

[Series 8: run · 1 of 1 slices shown (5 of 8)]
[im 1/1]
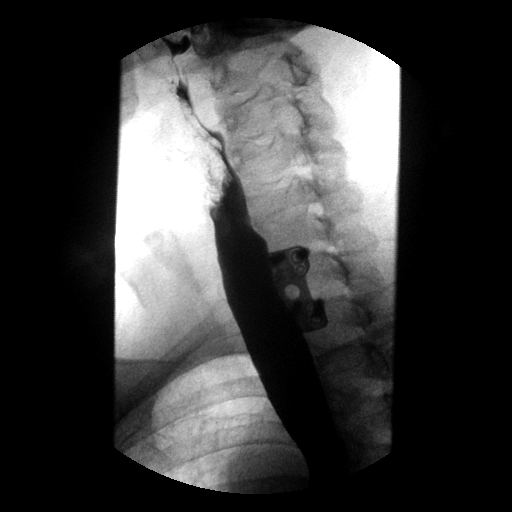

[Series 10: run · 1 of 1 slices shown (6 of 8)]
[im 1/1]
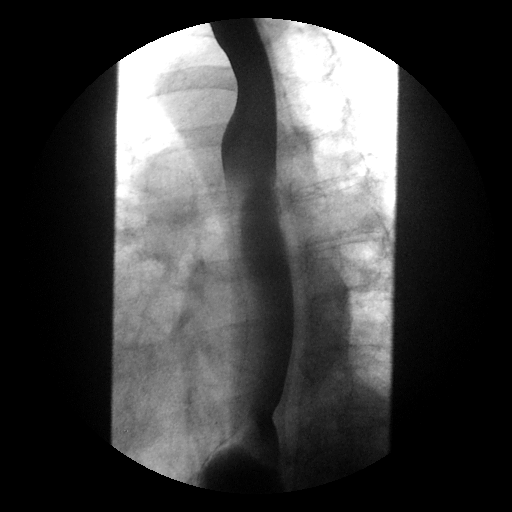

[Series 12: run · 1 of 1 slices shown (7 of 8)]
[im 1/1]
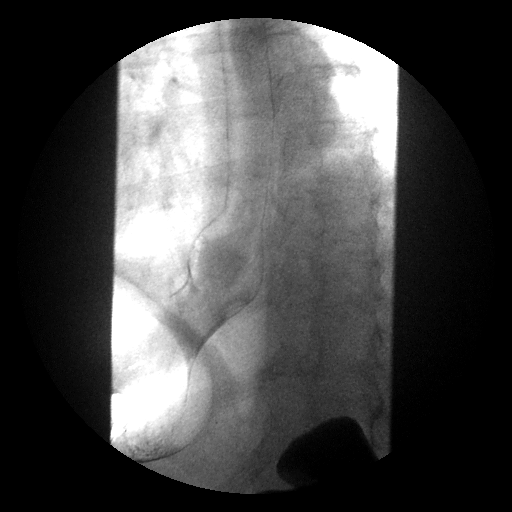

[Series 14: run · 1 of 1 slices shown (8 of 8)]
[im 1/1]
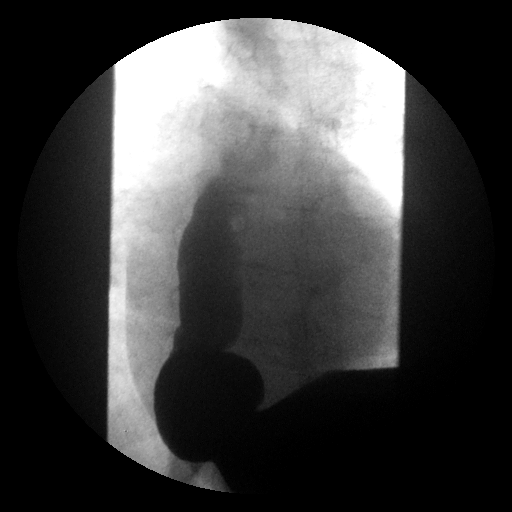

[13 of 24 positions shown; findings below may reference images not displayed]

FINDINGS: Mild decrease in esophageal motility.

Hiatal hernia with moderate gastroesophageal reflux. Negative for
stricture or mass. No diverticulum.

Barium tablet passed readily into the stomach without delay.
IMPRESSION: Hiatal hernia with gastroesophageal reflux

Esophageal dysmotility.

## 2022-07-03 ENCOUNTER — Emergency Department (HOSPITAL_COMMUNITY)
Admission: EM | Admit: 2022-07-03 | Discharge: 2022-07-03 | Disposition: A | Discharge status: Home / Self Care | Payer: Medicare Other | Attending: Emergency Medicine | Admitting: Emergency Medicine

## 2022-07-03 ENCOUNTER — Encounter (HOSPITAL_COMMUNITY): Payer: Self-pay | Admitting: *Deleted

## 2022-07-03 ENCOUNTER — Emergency Department (HOSPITAL_COMMUNITY): Payer: Medicare Other

## 2022-07-03 ENCOUNTER — Other Ambulatory Visit: Payer: Self-pay

## 2022-07-03 DIAGNOSIS — I10 Essential (primary) hypertension: Secondary | ICD-10-CM | POA: Diagnosis not present

## 2022-07-03 DIAGNOSIS — Z79899 Other long term (current) drug therapy: Secondary | ICD-10-CM | POA: Diagnosis not present

## 2022-07-03 DIAGNOSIS — K529 Noninfective gastroenteritis and colitis, unspecified: Secondary | ICD-10-CM | POA: Insufficient documentation

## 2022-07-03 DIAGNOSIS — R109 Unspecified abdominal pain: Secondary | ICD-10-CM | POA: Diagnosis present

## 2022-07-03 HISTORY — DX: Ulcerative colitis, unspecified, without complications: K51.90

## 2022-07-03 LAB — CBC WITH DIFFERENTIAL/PLATELET
Abs Immature Granulocytes: 0.02 10*3/uL (ref 0.00–0.07)
Basophils Absolute: 0 10*3/uL (ref 0.0–0.1)
Basophils Relative: 1 %
Eosinophils Absolute: 0.1 10*3/uL (ref 0.0–0.5)
Eosinophils Relative: 2 %
HCT: 40.5 % (ref 36.0–46.0)
Hemoglobin: 13 g/dL (ref 12.0–15.0)
Immature Granulocytes: 0 %
Lymphocytes Relative: 31 %
Lymphs Abs: 1.6 10*3/uL (ref 0.7–4.0)
MCH: 29.3 pg (ref 26.0–34.0)
MCHC: 32.1 g/dL (ref 30.0–36.0)
MCV: 91.2 fL (ref 80.0–100.0)
Monocytes Absolute: 0.3 10*3/uL (ref 0.1–1.0)
Monocytes Relative: 6 %
Neutro Abs: 3.2 10*3/uL (ref 1.7–7.7)
Neutrophils Relative %: 60 %
Platelets: 159 10*3/uL (ref 150–400)
RBC: 4.44 MIL/uL (ref 3.87–5.11)
RDW: 14.6 % (ref 11.5–15.5)
WBC: 5.3 10*3/uL (ref 4.0–10.5)
nRBC: 0 % (ref 0.0–0.2)

## 2022-07-03 LAB — URINALYSIS, ROUTINE W REFLEX MICROSCOPIC
Bacteria, UA: NONE SEEN
Bilirubin Urine: NEGATIVE
Glucose, UA: NEGATIVE mg/dL
Ketones, ur: NEGATIVE mg/dL
Nitrite: NEGATIVE
Protein, ur: NEGATIVE mg/dL
Specific Gravity, Urine: 1.011 (ref 1.005–1.030)
pH: 7 (ref 5.0–8.0)

## 2022-07-03 LAB — COMPREHENSIVE METABOLIC PANEL
ALT: 55 U/L — ABNORMAL HIGH (ref 0–44)
AST: 65 U/L — ABNORMAL HIGH (ref 15–41)
Albumin: 4.1 g/dL (ref 3.5–5.0)
Alkaline Phosphatase: 187 U/L — ABNORMAL HIGH (ref 38–126)
Anion gap: 9 (ref 5–15)
BUN: 20 mg/dL (ref 8–23)
CO2: 27 mmol/L (ref 22–32)
Calcium: 8.8 mg/dL — ABNORMAL LOW (ref 8.9–10.3)
Chloride: 99 mmol/L (ref 98–111)
Creatinine, Ser: 0.91 mg/dL (ref 0.44–1.00)
GFR, Estimated: >60 mL/min (ref >60)
Glucose, Bld: 89 mg/dL (ref 70–99)
Potassium: 4.6 mmol/L (ref 3.5–5.1)
Sodium: 135 mmol/L (ref 135–145)
Total Bilirubin: 1.1 mg/dL (ref 0.3–1.2)
Total Protein: 7.7 g/dL (ref 6.5–8.1)

## 2022-07-03 LAB — LIPASE, BLOOD: Lipase: 34 U/L (ref 11–51)

## 2022-07-03 MED ORDER — ONDANSETRON HCL 4 MG PO TABS: 4.0000 mg | ORAL_TABLET | Freq: Four times a day (QID) | ORAL | 0 refills | Status: AC | PRN

## 2022-07-03 MED ORDER — POLYETHYLENE GLYCOL 3350 17 GM/SCOOP PO POWD: 1.0000 | Freq: Once | ORAL | 0 refills | Status: AC

## 2022-07-03 MED ORDER — IOHEXOL 300 MG/ML  SOLN
100.0000 mL | Freq: Once | INTRAMUSCULAR | Status: AC | PRN
  Administered 2022-07-03: 100 mL via INTRAVENOUS

## 2022-07-03 MED ORDER — SODIUM CHLORIDE 0.9 % IV BOLUS
500.0000 mL | Freq: Once | INTRAVENOUS | Status: AC
  Administered 2022-07-03: 500 mL via INTRAVENOUS

## 2022-07-03 MED ORDER — ONDANSETRON HCL 4 MG/2ML IJ SOLN
4.0000 mg | Freq: Once | INTRAMUSCULAR | Status: AC
  Administered 2022-07-03: 4 mg via INTRAVENOUS
  Filled 2022-07-03: qty 2

## 2022-07-03 MED ORDER — ONDANSETRON HCL 4 MG PO TABS: 4.0000 mg | ORAL_TABLET | Freq: Four times a day (QID) | ORAL | 0 refills | Status: DC

## 2022-07-03 NOTE — Discharge Instructions (Addendum)
As discussed, CT imaging showed you have inflammation of the large part of your colon on the left side.  This is most likely causing your symptoms.  We have obtained a stool sample study to check for potential pathogen causing your symptoms.  As discussed, recommend using MiraLAX at home to help aid in soft regular bowel movements.  Attached is information for follow-up for gastroenterology.  I have spoken with him over the phone and they are expecting you.  Please do not hesitate to return to emergency department for worrisome signs and symptoms we discussed to become apparent.

## 2022-07-03 NOTE — ED Triage Notes (Addendum)
Pt with c/o constipation. +nausea/emesis  LBM yesterday and runny and "slimy looking"

## 2022-07-03 NOTE — ED Notes (Signed)
ED Provider at bedside. 

## 2022-07-03 NOTE — ED Provider Notes (Signed)
EMERGENCY DEPARTMENT AT [facility] Provider Note   CSN: [id number] Arrival date & time: 07/03/22  1057     History  Chief Complaint  Patient presents with   Constipation    Bonnie Reynolds is a 77 y.o. female.  HPI   77 year old female presents emergency department with complaints of abdominal pain, nausea, vomiting, decreased bowel movements.  Patient states she has a history of ulcerative colitis and follows with gastroenterology outpatient but has not seen them since 2017.  States she is dealt with constipation intermittently for the past few months.  Reports feelings of nausea as well as subsequent vomiting intermittently over the past few months.  Presents emergency department due to increasing abdominal cramping type pain as well as feelings of constipation.  Says she had a "slimy" bowel movement yesterday but still feels as if she is retaining bowel.  Denies fever, hematemesis, hematochezia/melena, urinary symptoms, chest pain, shortness of breath, vaginal symptoms.  Patient currently on no specific medications for treatment of ulcerative colitis.  Past medical history significant for neuropathy, fibromyalgia, hypertension, hypothyroidism, neuropathy  Home Medications Prior to Admission medications   Medication Sig Start Date End Date Taking? Authorizing Provider  polyethylene glycol powder (GLYCOLAX/MIRALAX) 17 GM/SCOOP powder Take 255 g by mouth once for 1 dose. 07/03/22 07/03/22 Yes Peter Garter, PA  Fish Oil-Cholecalciferol (FISH OIL + D3 PO) Take 1 capsule by mouth daily.    [provider]  hyoscyamine (LEVSIN SL) 0.125 MG SL tablet 1 sl AT 8 AM 12N AND 4 PM. No more than 8 tabs a day. 10/19/14   Fields, Darleene Cleaver, MD  levothyroxine (SYNTHROID, LEVOTHROID) 125 MCG tablet Take 125 mcg by mouth daily before breakfast.    [provider]  lisinopril-hydrochlorothiazide (PRINZIDE,ZESTORETIC) 10-12.5 MG per tablet Take 1 tablet by mouth daily.     [provider]  meloxicam (MOBIC) 7.5 MG tablet 1-2 PO DAILY TO PREVENT BACK PAIN 10/13/14   Fields, Darleene Cleaver, MD  mesalamine (PENTASA) 500 MG CR capsule Take 2 capsules (1,000 mg total) by mouth 4 (four) times daily. 11/09/14   Gelene Mink, NP  omeprazole (PRILOSEC) 20 MG capsule 1 PO 30 mins prior to breakfast and supper 09/26/14   Fields, Darleene Cleaver, MD  ondansetron (ZOFRAN) 4 MG tablet Take 1 tablet (4 mg total) by mouth every 6 (six) hours as needed for nausea or vomiting. 07/03/22   Peter Garter, PA  promethazine (PHENERGAN) 12.5 MG tablet 1/2 TO 2 po q6 h prn nausea or vomiting 10/13/14   Fields, Darleene Cleaver, MD  vitamin B-12 (CYANOCOBALAMIN) 1000 MCG tablet Take 1,000 mcg by mouth daily.    [provider]  VITAMIN D, CHOLECALCIFEROL, PO Take 1,000 Units by mouth daily.     [provider]      Allergies    Codeine and Prednisone    Review of Systems   Review of Systems  All other systems reviewed and are negative.   Physical Exam Updated Vital Signs BP (!) 209/97   Pulse 75   Temp 98 F (36.7 C)   Resp 16   Ht 5\' 3"  (1.6 m)   Wt 79.4 kg   SpO2 98%   BMI 31.00 kg/m  Physical Exam Vitals and nursing note reviewed.  Constitutional:      General: She is not in acute distress.    Appearance: She is well-developed.  HENT:     Head: Normocephalic and atraumatic.  Eyes:  Conjunctiva/sclera: Conjunctivae normal.  Cardiovascular:     Rate and Rhythm: Normal rate and regular rhythm.     Heart sounds: No murmur heard. Pulmonary:     Effort: Pulmonary effort is normal. No respiratory distress.     Breath sounds: Normal breath sounds.  Abdominal:     Palpations: Abdomen is soft.     Tenderness: There is abdominal tenderness. There is no guarding.     Comments: Left upper and left lower quadrant tenderness to palpation.  Musculoskeletal:        General: No swelling.     Cervical back: Neck supple.  Skin:    General: Skin is warm and dry.      Capillary Refill: Capillary refill takes less than 2 seconds.  Neurological:     Mental Status: She is alert.  Psychiatric:        Mood and Affect: Mood normal.    ED Results / Procedures / Treatments   Labs (all labs ordered are listed, but only abnormal results are displayed) Labs Reviewed  COMPREHENSIVE METABOLIC PANEL - Abnormal; Notable for the following components:      Result Value   Calcium 8.8 (*)    AST 65 (*)    ALT 55 (*)    Alkaline Phosphatase 187 (*)    All other components within normal limits  URINALYSIS, ROUTINE W REFLEX MICROSCOPIC - Abnormal; Notable for the following components:   Hgb urine dipstick SMALL (*)    Leukocytes,Ua SMALL (*)    All other components within normal limits  GASTROINTESTINAL PANEL BY PCR, STOOL (REPLACES STOOL CULTURE)  C DIFFICILE QUICK SCREEN W PCR REFLEX    CBC WITH DIFFERENTIAL/PLATELET  LIPASE, BLOOD    EKG None  Radiology CT ABDOMEN PELVIS W CONTRAST  Result Date: 07/03/2022 CLINICAL DATA:  Acute abdominal pain, nonlocalized. EXAM: CT ABDOMEN AND PELVIS WITH CONTRAST TECHNIQUE: Multidetector CT imaging of the abdomen and pelvis was performed using the standard protocol following bolus administration of intravenous contrast. RADIATION DOSE REDUCTION: This exam was performed according to the departmental dose-optimization program which includes automated exposure control, adjustment of the mA and/or kV according to patient size and/or use of iterative reconstruction technique. CONTRAST:  OMNIPAQUE IOHEXOL 300 MG/ML  SOLN COMPARISON:  CT examination dated September 12, 2014 FINDINGS: Lower chest: No acute abnormality. Hepatobiliary: No focal liver abnormality is seen. Status post cholecystectomy. No biliary dilatation. Pancreas: Unremarkable. No pancreatic ductal dilatation or surrounding inflammatory changes. Spleen: Small hypodense structures in the peripheral inferior aspect of the spleen suggesting small cyst or hemangioma.  Adrenals/Urinary Tract: Adrenal glands are unremarkable. Kidneys are normal, without renal calculi, focal lesion, or hydronephrosis. Bilateral small subcentimeter renal cortical cysts, likely benign Bosniak type 1 cysts. Bladder is unremarkable. Stomach/Bowel: Stomach is within normal limits. Appendix not identified. Scattered colonic diverticulosis without evidence of acute diverticulitis. There is generalized thickening of the descending and sigmoid colonic wall suggesting acute colitis. Vascular/Lymphatic: Aortic atherosclerosis. No enlarged abdominal or pelvic lymph nodes. Reproductive: Status post hysterectomy. No adnexal masses. Other: No abdominal wall hernia or abnormality. No abdominopelvic ascites. Musculoskeletal: Mild multilevel degenerate disc disease of the lumbar spine. No acute osseous abnormality. IMPRESSION: 1. Generalized thickening of the descending and sigmoid colonic wall suggesting acute colitis, which may be secondary to infectious or inflammatory etiology. 2. Scattered colonic diverticulosis without evidence of acute diverticulitis. 3. No evidence of nephrolithiasis or hydronephrosis. 4. Status post cholecystectomy and hysterectomy. 5. Aortic atherosclerosis. 6. Multilevel degenerate disc disease of the lumbar  spine. Electronically Signed   By: Larose Hires D.O.   On: 07/03/2022 13:26    Procedures Procedures    Medications Ordered in ED Medications  sodium chloride 0.9 % bolus 500 mL (0 mLs Intravenous Stopped 07/03/22 1437)  ondansetron (ZOFRAN) injection 4 mg (4 mg Intravenous Given 07/03/22 1145)  iohexol (OMNIPAQUE) 300 MG/ML solution 100 mL (100 mLs Intravenous Contrast Given 07/03/22 1232)    ED Course/ Medical Decision Making/ A&P Clinical Course as of 07/03/22 1735  Thu Jul 03, 2022  1345 Consulted gastroenterology Dr. Earmon Phoenix who recommended obtaining stool study, with holding treatment at this time with follow-up outpatient with gastroenterology. [CR]    Clinical  Course User Index [CR] Peter Garter, PA                             Medical Decision Making Amount and/or Complexity of Data Reviewed Labs: ordered. Radiology: ordered.  Risk Prescription drug management.   This patient presents to the ED for concern of constipation/abdominal pain, this involves an extensive number of treatment options, and is a complaint that carries with it a high risk of complications and morbidity.  The differential diagnosis includes gastritis, pancreatitis, CBD pathology, cholecystitis, hepatitis, SBO/LBO, colitis, nephrolithiasis, pyelonephritis, cystitis, diverticulitis, appendicitis, AAA, aortic aneurysm, gastroenteritis   Co morbidities that complicate the patient evaluation  See HPI   Additional history obtained:  Additional history obtained from EMR External records from outside source obtained and reviewed including hospital records   Lab Tests:  I Ordered, and personally interpreted labs.  The pertinent results include: Hospital records   Imaging Studies ordered:  I ordered imaging studies including CT abdomen pelvis I independently visualized and interpreted imaging which showed generalized thickening of descending and sigmoid colon wall.  Scattered colonic diverticulosis without diverticulitis.  Multilevel degenerative disc disease of the lumbar spine I agree with the radiologist interpretation  Cardiac Monitoring: / EKG:  The patient was maintained on a cardiac monitor.  I personally viewed and interpreted the cardiac monitored which showed an underlying rhythm of: Sinus rhythm   Consultations Obtained:  See ED course  Problem List / ED Course / Critical interventions / Medication management  Colitis I ordered medication including 500 cc normal saline, Zofran   Reevaluation of the patient after these medicines showed that the patient improved I have reviewed the patients home medicines and have made adjustments as  needed   Social Determinants of Health:  Denies tobacco, illicit drug use   Test / Admission - Considered:  Colitis Vitals signs significant for hypertension, blood pressure 192/87; patient without home antihypertensive medications today.. Otherwise within normal range and stable throughout visit. Laboratory/imaging studies significant for: See above 77 year old female presents emergency department with complaints of abdominal pain, mucousy stool, intermittent nausea/vomiting.  Symptoms seem to be chronic occurring over the past 1 to 3 months without acute worsening in the recent past.  Patient with history of UC but has not follow-up with GI outpatient.  Patient found with evidence of left-sided colitis.  Reached out to gastroenterology Dr. Earmon Phoenix regarding the Patient Who Recommended Obtaining Stool Sample for GI panel and follow-up outpatient for reassessment.  Symptomatic therapy recommended home with bowel rest and bland/liquid diet as well as Tylenol for pain.patient overall well-appearing, afebrile in no acute distress, tolerating p.o. without difficulty.  Treatment plan discussed at length with patient and she acknowledged understanding was agreeable to said plan.   Worrisome signs and  symptoms were discussed with the patient, and the patient acknowledged understanding to return to the ED if noticed. Patient was stable upon discharge.          Final Clinical Impression(s) / ED Diagnoses Final diagnoses:  Colitis    Rx / DC Orders ED Discharge Orders          Ordered    ondansetron (ZOFRAN) 4 MG tablet  Every 6 hours,   Status:  Discontinued        07/03/22 1347    polyethylene glycol powder (GLYCOLAX/MIRALAX) 17 GM/SCOOP powder   Once        07/03/22 1348    ondansetron (ZOFRAN) 4 MG tablet  Every 6 hours PRN        07/03/22 1348              Peter Garter, [place] 07/03/22 1735    Eber Hong, MD 07/07/22 1012

## 2022-07-08 ENCOUNTER — Encounter: Payer: Self-pay | Admitting: Gastroenterology

## 2022-07-08 ENCOUNTER — Ambulatory Visit (INDEPENDENT_AMBULATORY_CARE_PROVIDER_SITE_OTHER): Payer: Medicare Other | Admitting: Gastroenterology

## 2022-07-08 VITALS — BP 188/93 | HR 67 | Temp 97.6°F | Ht 63.0 in | Wt 182.8 lb

## 2022-07-08 DIAGNOSIS — R7989 Other specified abnormal findings of blood chemistry: Secondary | ICD-10-CM | POA: Diagnosis not present

## 2022-07-08 DIAGNOSIS — R933 Abnormal findings on diagnostic imaging of other parts of digestive tract: Secondary | ICD-10-CM | POA: Diagnosis not present

## 2022-07-08 NOTE — Progress Notes (Unsigned)
GI Office Note    Referring Provider: Charlette Caffey Primary Care Physician:  Charlette Caffey  Primary Gastroenterologist:  Chief Complaint   Chief Complaint  Patient presents with   Crohn's Disease     History of Present Illness   Bonnie Reynolds is a 77 y.o. female presenting today for follow-up of recent ED visit.  Patient last seen in our practice in 2017.  Referred to [facility] ED at that time for concerns for possible Crohn's based on numerous ulcerations of the small bowel seen on capsule endoscopy.   Seen in the ED May 9 with abdominal pain, vomiting, decreased bowel movements.  History of "ulcerative colitis" but has not been seen by GI as an outpatient since 2017.***   History of ischemic colitis.  H. pylori stool antigen test - March 2017. [facility] IBD diagnostic study: Consistent with ulcerative colitis***    *** Flares:   Two months, slimy, hard balls. Feels like not having BMs/blocked. Vomiting.  Dry heaves. Sick for a month.   Ulcers mouth, then diarrhea, then sores. Brbpr. When gets "blocked"    ***meds  No heartburn No dysphagia.  Abdominal cramping like needs to have BM.  Miralax one capful daily. About a week.  Stool little softer.  Brother crohns Not June 19th  Hepatitis in fourth grade [employer]. Has three jobs. Caregiver and cuts hair    In the ED: Sodium 135, potassium 4.6, BUN 20, creatinine 0.98, total bilirubin 1.1, alkaline phosphatase 187, AST 65, ALT 55, albumin 4.1.  White blood cell count 5300, hemoglobin 13, platelets 159,000, lipase 34.  CT abdomen pelvis with contrast 07/03/22: Generalized thickening of the descending and sigmoid colon wall suggesting acute colitis, may be infectious versus inflammatory.  Colonic diverticulosis.  Small bowel capsule endoscopy August 2016: Multiple small bowel ulcers  Small bowel enteroscopy September 2016: Moderate sized hiatal hernia.  2 ulcerated gastric nodules seen  and biopsied and benign with no H. pylori, duodenal mucosa normal.  No abnormality seen in the jejunum.  EGD September 26, 2014: Mild nonerosive gastritis found in the gastric antrum status post biopsy, chronic gastritis no H. pylori.  Small bowel biopsies obtained and benign, EGD aborted due to stridor.  Complete exam but limited pictures.  Colonoscopy September 26, 2014: Terminal ileum appeared normal.  Colon mucosa appeared normal.  Multiple biopsies benign.  Small internal hemorrhoids.  Medications   Current Outpatient Medications  Medication Sig Dispense Refill   cyclobenzaprine (FLEXERIL) 5 MG tablet Take 5 mg by mouth 3 (three) times daily as needed for muscle spasms.     DULoxetine (CYMBALTA) 60 MG capsule Take 60 mg by mouth daily.     levothyroxine (SYNTHROID) 137 MCG tablet Take 137 mcg by mouth daily before breakfast.     ondansetron (ZOFRAN) 4 MG tablet Take 1 tablet (4 mg total) by mouth every 6 (six) hours as needed for nausea or vomiting. 12 tablet 0   polyethylene glycol powder (MIRALAX) 17 GM/SCOOP powder Take 1 Container by mouth daily.     rosuvastatin (CRESTOR) 20 MG tablet Take 1 tablet by mouth daily.     sucralfate (CARAFATE) 1 g tablet Take 1 tablet by mouth 4 (four) times daily.     hydrochlorothiazide (HYDRODIURIL) 25 MG tablet Take 25 mg by mouth daily.     No current facility-administered medications for this visit.    Allergies   Allergies as of 07/08/2022 - Review Complete 07/08/2022  Allergen Reaction Noted  Aspirin  11/23/2014   Codeine Other (See Comments) 06/21/2014   Prednisone Swelling 06/21/2014    Past Medical History   Past Medical History:  Diagnosis Date   Fibromyalgia    Hypertension    Hypothyroidism    Neuropathy    Neuropathy 02/25/2012    Past Surgical History   Past Surgical History:  Procedure Laterality Date   ABDOMINAL HYSTERECTOMY     cancer   APPENDECTOMY     BACK SURGERY     X 2, cervical and thoracic   BIOPSY  09/26/2014    Procedure: BIOPSY;  Surgeon: West Bali, MD;  Location: [facility];  Service: Endoscopy;;  random colon, duodenal    CHOLECYSTECTOMY     COLONOSCOPY  08/2006   Dr. Gala Romney Shiflett: Normal colon except for diverticulosis and 2 rectal polyps, one was tubular adenoma.   COLONOSCOPY  2004   Dr. Riley Lam Shiflett: unremarkable   COLONOSCOPY WITH PROPOFOL N/A 09/26/2014   Procedure: COLONOSCOPY WITH PROPOFOL;  Surgeon: West Bali, MD;  Location: [facility];  Service: Endoscopy;  Laterality: N/A;  cecum time in=1017  time out=1028  total withdrawal time=73minutes procedure 1   ENTEROSCOPY N/A 11/07/2014   Procedure: ENTEROSCOPY WITH PROPOFOL;  Surgeon: West Bali, MD;  Location: [facility];  Service: Endoscopy;  Laterality: N/A;  with PROPOFOL 730   ESOPHAGOGASTRODUODENOSCOPY  02/2013   Dr. Samuella Cota: small hiatal hernia and mild gastritis. Negative for H pylori.   ESOPHAGOGASTRODUODENOSCOPY (EGD) WITH PROPOFOL N/A 09/26/2014   Procedure: ESOPHAGOGASTRODUODENOSCOPY (EGD) WITH PROPOFOL;  Surgeon: West Bali, MD;  Location: [facility];  Service: Endoscopy;  Laterality: N/A;   FLEXIBLE SIGMOIDOSCOPY  04/2013   Dr. Samuella Cota, Propofol: exam to 60cm, abnormal mucosa with edema, erythema and ulceration proximal to 30 cm consistent with ischemic bowel disease. Biopsies taken, we didn't ever receive those records however. Sigmoid diverticulosis also seen.   GIVENS CAPSULE STUDY N/A 10/09/2014   Procedure: GIVENS CAPSULE STUDY;  Surgeon: West Bali, MD;  Location: [facility];  Service: Endoscopy;  Laterality: N/A;  0700   TONSILLECTOMY      Past Family History   Family History  Problem Relation Age of Onset   Colitis Brother        1/2 brother   Colon cancer Neg Hx    Colon polyps Neg Hx    Neuropathy Mother        deceased age 85   CVA Father        deceased age 25    Past Social History   Social History   Socioeconomic History   Marital status: Married    Spouse name: Not on file   Number of  children: 2   Years of education: Not on file   Highest education level: Not on file  Occupational History   Occupation: Producer, television/film/video  Tobacco Use   Smoking status: Never   Smokeless tobacco: Not on file  Substance and Sexual Activity   Alcohol use: No    Alcohol/week: 0.0 standard drinks of alcohol   Drug use: No   Sexual activity: Not Currently  Other Topics Concern   Not on file  Social History Narrative   TWO GRANDKIDS-AGE 87(TWINS) LIVE WITH HER. DAUGHTER WORKING AS A PHLEBOTOMIST.    Social Determinants of Health   Financial Resource Strain: Not on file  Food Insecurity: Not on file  Transportation Needs: Not on file  Physical Activity: Not on file  Stress: Not on file  Social Connections: Not on file  Intimate Partner Violence: Not on file    Review of Systems   General: Negative for anorexia, weight loss, fever, chills, +fatigue, +weakness. Eyes: Negative for vision changes.  ENT: Negative for hoarseness, difficulty swallowing , nasal congestion. CV: Negative for chest pain, angina, palpitations, dyspnea on exertion, peripheral edema.  Respiratory: Negative for dyspnea at rest, dyspnea on exertion, cough, sputum, wheezing.  GI: See history of present illness. GU:  Negative for dysuria, hematuria, urinary incontinence, urinary frequency, nocturnal urination.  MS: Negative for joint pain. + low back pain.  Derm: Negative for rash or itching.  Neuro: Negative for weakness, abnormal sensation, seizure, frequent headaches, memory loss,  confusion.  Psych: Negative for anxiety, depression, suicidal ideation, hallucinations.  Endo: Negative for unusual weight change.  Heme: Negative for bruising or bleeding. Allergy: Negative for rash or hives.  Physical Exam   BP (!) 188/93 (BP Location: Right Arm, Patient Position: Sitting, Cuff Size: Large)   Pulse 67   Temp 97.6 F (36.4 C) (Oral)   Ht 5\' 3"  (1.6 m)   Wt 182 lb 12.8 oz (82.9 kg)   SpO2 97%   BMI 32.38  kg/m    General: Well-nourished, well-developed in no acute distress.  Head: Normocephalic, atraumatic.   Eyes: Conjunctiva pink, no icterus. Mouth: Oropharyngeal mucosa moist and pink , no lesions erythema or exudate. Neck: Supple without thyromegaly, masses, or lymphadenopathy.  Lungs: Clear to auscultation bilaterally.  Heart: Regular rate and rhythm, no murmurs rubs or gallops.  Abdomen: Bowel sounds are normal,  nondistended, no hepatosplenomegaly or masses,  no abdominal bruits or hernia, no rebound or guarding.  Mild diffuse tenderness mostly central abdomen Rectal: deferred Extremities: No lower extremity edema. No clubbing or deformities.  Neuro: Alert and oriented x 4 , grossly normal neurologically.  Skin: Warm and dry, no rash or jaundice.   Psych: Alert and cooperative, normal mood and affect.  Labs   *** Imaging Studies   CT ABDOMEN PELVIS W CONTRAST  Result Date: 07/03/2022 CLINICAL DATA:  Acute abdominal pain, nonlocalized. EXAM: CT ABDOMEN AND PELVIS WITH CONTRAST TECHNIQUE: Multidetector CT imaging of the abdomen and pelvis was performed using the standard protocol following bolus administration of intravenous contrast. RADIATION DOSE REDUCTION: This exam was performed according to the departmental dose-optimization program which includes automated exposure control, adjustment of the mA and/or kV according to patient size and/or use of iterative reconstruction technique. CONTRAST:  OMNIPAQUE IOHEXOL 300 MG/ML  SOLN COMPARISON:  CT examination dated September 12, 2014 FINDINGS: Lower chest: No acute abnormality. Hepatobiliary: No focal liver abnormality is seen. Status post cholecystectomy. No biliary dilatation. Pancreas: Unremarkable. No pancreatic ductal dilatation or surrounding inflammatory changes. Spleen: Small hypodense structures in the peripheral inferior aspect of the spleen suggesting small cyst or hemangioma. Adrenals/Urinary Tract: Adrenal glands are  unremarkable. Kidneys are normal, without renal calculi, focal lesion, or hydronephrosis. Bilateral small subcentimeter renal cortical cysts, likely benign Bosniak type 1 cysts. Bladder is unremarkable. Stomach/Bowel: Stomach is within normal limits. Appendix not identified. Scattered colonic diverticulosis without evidence of acute diverticulitis. There is generalized thickening of the descending and sigmoid colonic wall suggesting acute colitis. Vascular/Lymphatic: Aortic atherosclerosis. No enlarged abdominal or pelvic lymph nodes. Reproductive: Status post hysterectomy. No adnexal masses. Other: No abdominal wall hernia or abnormality. No abdominopelvic ascites. Musculoskeletal: Mild multilevel degenerate disc disease of the lumbar spine. No acute osseous abnormality. IMPRESSION: 1. Generalized thickening of the descending and sigmoid colonic wall suggesting acute colitis,  which may be secondary to infectious or inflammatory etiology. 2. Scattered colonic diverticulosis without evidence of acute diverticulitis. 3. No evidence of nephrolithiasis or hydronephrosis. 4. Status post cholecystectomy and hysterectomy. 5. Aortic atherosclerosis. 6. Multilevel degenerate disc disease of the lumbar spine. Electronically Signed   By: Larose Hires D.O.   On: 07/03/2022 13:26    Assessment      The patient was found to have elevated blood pressure when vital signs were checked in the office. The blood pressure was rechecked by the nursing staff and it was found be persistently elevated >140/90 mmHg. I personally advised to the patient to follow up closely with his PCP for hypertension control.  PLAN   ***   Leanna Battles. Melvyn Neth, MHS, PA-C [facility]

## 2022-07-08 NOTE — H&P (View-Only) (Signed)
GI Office Note    Referring Provider: Charlette Caffey Primary Care Physician:  Terance Hart, PA-C  Primary Gastroenterologist: formerly Dr. Darrick Penna  Chief Complaint   Chief Complaint  Patient presents with   Crohn's Disease     History of Present Illness   Bonnie Reynolds is a 77 y.o. female presenting today for follow-up of recent ED visit.    Patient last seen in our practice in 2017.  Referred to [facility] at that time for concerns for possible Crohn's based on numerous ulcerations of the small bowel seen on capsule endoscopy. At the time she was seen, she was having less diarrhea and the work up seemed to be more focused on upper GI issues. She has GES which was normal. She had negative H.pylori stool antigen. Patient did not go back for follow up.  She had a [facility] diagnostic labs completed here locally with results of "consistent with ulcerative colitis", which would not explain the small bowel ulcer seen on capsule study and normal colonoscopy.  Patient was lost to follow up to [place] as well. She also has a history of ischemic colitis 2015, based on flex sig done at outside facility. 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 One year follow up CTA abd recommended but has not been completed.   Seen in the ED May 9 with abdominal pain, vomiting, decreased bowel movements.  She reports having stomach issues for a couple of months.  Stools slimy, hard small balls.  Feels like she needs to have a bowel movement (develops abdominal cramping) but something is "blocking" her bowels.  Having a lot of nausea and intermittent vomiting/dry heaves.  Notes bright red blood per rectum at times.  He has been on MiraLAX 1 capful daily for about a week.  Stools are little softer but not more frequent.  She denies any heartburn or dysphagia.  In the ED: Sodium 135, potassium 4.6, BUN 20, creatinine 0.98,  total bilirubin 1.1, alkaline phosphatase 187, AST 65, ALT 55, albumin 4.1.  White blood cell count 5300, hemoglobin 13, platelets 159,000, lipase 34.  CT abdomen pelvis with contrast 07/03/22: Generalized thickening of the descending and sigmoid colon wall suggesting acute colitis, may be infectious versus inflammatory.  Colonic diverticulosis.  Patient has a history of hepatitis when she was in the fourth grade. Her brother has a history of Crohn's disease. Over the years she has "flares" that she describes as initially starting out with ulcers in her mouth.  Following this she typically gets diarrhea associated with perianal irritation.    Small bowel enteroscopy September 2016: Moderate sized hiatal hernia.  2 ulcerated gastric nodules seen and biopsied and benign with no H. pylori, duodenal mucosa normal.  No abnormality seen in the jejunum.  Small bowel capsule endoscopy August 2016: Multiple small bowel ulcers  EGD September 26, 2014: Mild nonerosive gastritis found in the gastric antrum status post biopsy, chronic gastritis no H. pylori.  Small bowel biopsies obtained and benign, EGD aborted due to stridor.  Complete exam but limited pictures.  Colonoscopy September 26, 2014: Terminal ileum appeared normal.  Colon mucosa appeared normal.  Multiple biopsies benign.  Small internal hemorrhoids.  Medications   Current Outpatient Medications  Medication Sig Dispense Refill   cyclobenzaprine (FLEXERIL) 5 MG tablet Take 5 mg by mouth 3 (three) times daily as needed for muscle spasms.  DULoxetine (CYMBALTA) 60 MG capsule Take 60 mg by mouth daily.     levothyroxine (SYNTHROID) 137 MCG tablet Take 137 mcg by mouth daily before breakfast.     ondansetron (ZOFRAN) 4 MG tablet Take 1 tablet (4 mg total) by mouth every 6 (six) hours as needed for nausea or vomiting. 12 tablet 0   polyethylene glycol powder (MIRALAX) 17 GM/SCOOP powder Take 1 Container by mouth daily.     rosuvastatin (CRESTOR) 20 MG  tablet Take 1 tablet by mouth daily.     sucralfate (CARAFATE) 1 g tablet Take 1 tablet by mouth 4 (four) times daily.     hydrochlorothiazide (HYDRODIURIL) 25 MG tablet Take 25 mg by mouth daily.     No current facility-administered medications for this visit.    Allergies   Allergies as of 07/08/2022 - Review Complete 07/08/2022  Allergen Reaction Noted   Aspirin  11/23/2014   Codeine Other (See Comments) 06/21/2014   Prednisone Swelling 06/21/2014    Past Medical History   Past Medical History:  Diagnosis Date   Fibromyalgia    Hypertension    Hypothyroidism    Neuropathy    Neuropathy 02/25/2012    Past Surgical History   Past Surgical History:  Procedure Laterality Date   ABDOMINAL HYSTERECTOMY     cancer   APPENDECTOMY     BACK SURGERY     X 2, cervical and thoracic   BIOPSY  09/26/2014   Procedure: BIOPSY;  Surgeon: West Bali, MD;  Location: [facility];  Service: Endoscopy;;  random colon, duodenal    CHOLECYSTECTOMY     COLONOSCOPY  08/2006   Dr. Gala Romney Shiflett: Normal colon except for diverticulosis and 2 rectal polyps, one was tubular adenoma.   COLONOSCOPY  2004   Dr. Riley Lam Shiflett: unremarkable   COLONOSCOPY WITH PROPOFOL N/A 09/26/2014   Procedure: COLONOSCOPY WITH PROPOFOL;  Surgeon: West Bali, MD;  Location: [facility];  Service: Endoscopy;  Laterality: N/A;  cecum time in=1017  time out=1028  total withdrawal time=67minutes procedure 1   ENTEROSCOPY N/A 11/07/2014   Procedure: ENTEROSCOPY WITH PROPOFOL;  Surgeon: West Bali, MD;  Location: [facility];  Service: Endoscopy;  Laterality: N/A;  with PROPOFOL 730   ESOPHAGOGASTRODUODENOSCOPY  02/2013   Dr. Samuella Cota: small hiatal hernia and mild gastritis. Negative for H pylori.   ESOPHAGOGASTRODUODENOSCOPY (EGD) WITH PROPOFOL N/A 09/26/2014   Procedure: ESOPHAGOGASTRODUODENOSCOPY (EGD) WITH PROPOFOL;  Surgeon: West Bali, MD;  Location: [facility];  Service: Endoscopy;  Laterality: N/A;   FLEXIBLE SIGMOIDOSCOPY   04/2013   Dr. Samuella Cota, Propofol: exam to 60cm, abnormal mucosa with edema, erythema and ulceration proximal to 30 cm consistent with ischemic bowel disease. Biopsies taken, we didn't ever receive those records however. Sigmoid diverticulosis also seen.   GIVENS CAPSULE STUDY N/A 10/09/2014   Procedure: GIVENS CAPSULE STUDY;  Surgeon: West Bali, MD;  Location: [facility];  Service: Endoscopy;  Laterality: N/A;  0700   TONSILLECTOMY      Past Family History   Family History  Problem Relation Age of Onset   Colitis Brother        1/2 brother   Colon cancer Neg Hx    Colon polyps Neg Hx    Neuropathy Mother        deceased age 79   CVA Father        deceased age 54    Past Social History   Social History   Socioeconomic History  Marital status: Married    Spouse name: Not on file   Number of children: 2   Years of education: Not on file   Highest education level: Not on file  Occupational History   Occupation: hair dresser  Tobacco Use   Smoking status: Never   Smokeless tobacco: Not on file  Substance and Sexual Activity   Alcohol use: No    Alcohol/week: 0.0 standard drinks of alcohol   Drug use: No   Sexual activity: Not Currently  Other Topics Concern   Not on file  Social History Narrative   TWO GRANDKIDS-AGE 32(TWINS) LIVE WITH HER. DAUGHTER WORKING AS A PHLEBOTOMIST.    Social Determinants of Health   Financial Resource Strain: Not on file  Food Insecurity: Not on file  Transportation Needs: Not on file  Physical Activity: Not on file  Stress: Not on file  Social Connections: Not on file  Intimate Partner Violence: Not on file    Review of Systems   General: Negative for anorexia, weight loss, fever, chills, +fatigue, +weakness. Eyes: Negative for vision changes.  ENT: Negative for hoarseness, difficulty swallowing , nasal congestion. CV: Negative for chest pain, angina, palpitations, dyspnea on exertion, peripheral edema.  Respiratory:  Negative for dyspnea at rest, dyspnea on exertion, cough, sputum, wheezing.  GI: See history of present illness. GU:  Negative for dysuria, hematuria, urinary incontinence, urinary frequency, nocturnal urination.  MS: Negative for joint pain. + low back pain.  Derm: Negative for rash or itching.  Neuro: Negative for weakness, abnormal sensation, seizure, frequent headaches, memory loss,  confusion.  Psych: Negative for anxiety, depression, suicidal ideation, hallucinations.  Endo: Negative for unusual weight change.  Heme: Negative for bruising or bleeding. Allergy: Negative for rash or hives.  Physical Exam   BP (!) 188/93 (BP Location: Right Arm, Patient Position: Sitting, Cuff Size: Large)   Pulse 67   Temp 97.6 F (36.4 C) (Oral)   Ht 5\' 3"  (1.6 m)   Wt 182 lb 12.8 oz (82.9 kg)   SpO2 97%   BMI 32.38 kg/m    General: Well-nourished, well-developed in no acute distress.  Head: Normocephalic, atraumatic.   Eyes: Conjunctiva pink, no icterus. Mouth: Oropharyngeal mucosa moist and pink , no lesions erythema or exudate. Neck: Supple without thyromegaly, masses, or lymphadenopathy.  Lungs: Clear to auscultation bilaterally.  Heart: Regular rate and rhythm, no murmurs rubs or gallops.  Abdomen: Bowel sounds are normal,  nondistended, no hepatosplenomegaly or masses,  no abdominal bruits or hernia, no rebound or guarding.  Mild diffuse tenderness mostly central abdomen Rectal: deferred Extremities: No lower extremity edema. No clubbing or deformities.  Neuro: Alert and oriented x 4 , grossly normal neurologically.  Skin: Warm and dry, no rash or jaundice.   Psych: Alert and cooperative, normal mood and affect.  Labs   See hpi  Imaging Studies   CT ABDOMEN PELVIS W CONTRAST  Result Date: 07/03/2022 CLINICAL DATA:  Acute abdominal pain, nonlocalized. EXAM: CT ABDOMEN AND PELVIS WITH CONTRAST TECHNIQUE: Multidetector CT imaging of the abdomen and pelvis was performed using the  standard protocol following bolus administration of intravenous contrast. RADIATION DOSE REDUCTION: This exam was performed according to the departmental dose-optimization program which includes automated exposure control, adjustment of the mA and/or kV according to patient size and/or use of iterative reconstruction technique. CONTRAST:  OMNIPAQUE IOHEXOL 300 MG/ML  SOLN COMPARISON:  CT examination dated September 12, 2014 FINDINGS: Lower chest: No acute abnormality. Hepatobiliary: No focal liver  abnormality is seen. Status post cholecystectomy. No biliary dilatation. Pancreas: Unremarkable. No pancreatic ductal dilatation or surrounding inflammatory changes. Spleen: Small hypodense structures in the peripheral inferior aspect of the spleen suggesting small cyst or hemangioma. Adrenals/Urinary Tract: Adrenal glands are unremarkable. Kidneys are normal, without renal calculi, focal lesion, or hydronephrosis. Bilateral small subcentimeter renal cortical cysts, likely benign Bosniak type 1 cysts. Bladder is unremarkable. Stomach/Bowel: Stomach is within normal limits. Appendix not identified. Scattered colonic diverticulosis without evidence of acute diverticulitis. There is generalized thickening of the descending and sigmoid colonic wall suggesting acute colitis. Vascular/Lymphatic: Aortic atherosclerosis. No enlarged abdominal or pelvic lymph nodes. Reproductive: Status post hysterectomy. No adnexal masses. Other: No abdominal wall hernia or abnormality. No abdominopelvic ascites. Musculoskeletal: Mild multilevel degenerate disc disease of the lumbar spine. No acute osseous abnormality. IMPRESSION: 1. Generalized thickening of the descending and sigmoid colonic wall suggesting acute colitis, which may be secondary to infectious or inflammatory etiology. 2. Scattered colonic diverticulosis without evidence of acute diverticulitis. 3. No evidence of nephrolithiasis or hydronephrosis. 4. Status post cholecystectomy  and hysterectomy. 5. Aortic atherosclerosis. 6. Multilevel degenerate disc disease of the lumbar spine. Electronically Signed   By: Larose Hires D.O.   On: 07/03/2022 13:26    Assessment   Abnormal colon CT: longstanding history of GI issues with concern in the past for small bowel Crohn's based on capsule findings, initially treated with pentasa awaiting further evaluation by GI at [facility]. [facility] IBD panel with findings of "consistent with UC" but this did not fit the clinical scenario at the time. Patient was lost to follow up. Now presented with abnormal descending and sigmoid colon on CT, ongoing "flares" of ulcers in the mouth/diarrhea/brbpr. She needs updated colonoscopy at this time.   H/O adenomatous colon polyps: due for colonoscopy at this time.  H/O ischemic colitis: 2015, normal SMA, IMA, celiac on CTA 2016.  Elevated LFTs: ?new finding. 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 Recent CT without reported aneurysm. Will ask for focused read by radiology to see if she still needs CTA.  The patient was found to have elevated blood pressure when vital signs were checked in the office. The blood pressure was rechecked by the nursing staff and it was found be persistently elevated >140/90 mmHg. I personally advised to the patient to follow up closely with his PCP for hypertension control.  PLAN    Will have radiology look for splenic artery aneurysm on current CT. She may still require formal CTA, further recommendations to follow.  After patient calls with a complete med list we will schedule her for Colonoscopy with Dr. Marletta Lor. ASA 2. I have discussed the risks, alternatives, benefits with regards to but not limited to the risk of reaction to medication, bleeding, infection, perforation and the patient is agreeable to proceed. Written consent to be  obtained. Labs to evaluate abnormal LFTs Take miralax 17 grams bid until soft stool, then continue once daily.  Leanna Battles. Melvyn Neth, MHS, PA-C [facility]

## 2022-07-08 NOTE — Patient Instructions (Addendum)
Call with complete medication list. Call Tammy, CMA at [phone]. Please complete labs at [facility]. If you have not heard with results within 5 business days, please call our office at above number.  Take miralax one capful twice daily until soft stool, then continue once daily.  Colonoscopy to be scheduled once we have received updated medication list.  Please follow up with PCP regarding elevated blood pressure.

## 2022-07-09 ENCOUNTER — Telehealth: Payer: Self-pay

## 2022-07-09 NOTE — Telephone Encounter (Signed)
Pt's medication list has been updated. Pt states that she will call back if her doctor adds anything for her bp.

## 2022-07-10 ENCOUNTER — Other Ambulatory Visit: Payer: Self-pay

## 2022-07-10 ENCOUNTER — Telehealth: Payer: Self-pay

## 2022-07-10 NOTE — Telephone Encounter (Signed)
Pt called back today and added Hydrochlorothiazide 25 mg daily to her medication list.

## 2022-07-10 NOTE — Telephone Encounter (Signed)
Outside labs are in box for review.

## 2022-07-11 ENCOUNTER — Telehealth: Payer: Self-pay

## 2022-07-11 NOTE — Telephone Encounter (Signed)
Pt called wanting to know if you have had a chance to look over her medications and lab results so that she can be scheduled for her procedures. Pt states that she has been out of work for a couple months now due to being sick and she is wanting to get back to work. Pt is aware that she won't be getting a return call back today.

## 2022-07-14 ENCOUNTER — Other Ambulatory Visit: Payer: Self-pay

## 2022-07-14 ENCOUNTER — Encounter: Payer: Self-pay | Admitting: Gastroenterology

## 2022-07-14 DIAGNOSIS — R7989 Other specified abnormal findings of blood chemistry: Secondary | ICD-10-CM

## 2022-07-14 NOTE — Telephone Encounter (Signed)
noted 

## 2022-07-14 NOTE — Telephone Encounter (Addendum)
Received labs completed at [facility] lab care.  Dated 07/08/2022: Hepatitis A antibody total positive, TTG IgA less than 2, hepatitis B surface antigen nonreactive, hepatitis C antibody nonreactive, hepatitis B surface antibody less than 1, smooth muscle antibody negative, ANA profile negative, albumin 4, total bilirubin 0.9, alkaline phosphatase 218 high, AST 71 high, ALT 63 high, iron 98, iron saturation 16% low, TIBC 601 high, ferritin 18.2 low normal, CRP 0.45 high, sed rate 18.  IgA/IgG/IgM, Mitochondrial antibody did not get completed at the [facility] labs as I requested.   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 Her alkphos, AST, ALT are still high, slightly higher than the week before. She has some iron deficiency. Unfortunately the labs did not complete all the labs requested, I still need her to have IgA/IgG/IgM, mitochondrial antibody to help work up abnormal LFTs. Please place orders again or give her the order forms.   OK to schedule colonoscopy. Dx: abnormal colon on CT ASA 2. [place].  Needs BMET updated due to diuretic unless the one from 07/03/22 is sufficient. Use bisacodyl 10mg  po daily for 3 days before bowel prep.

## 2022-07-14 NOTE — Telephone Encounter (Signed)
Pt called back today to see if her procedures have been scheduled.

## 2022-07-14 NOTE — Telephone Encounter (Signed)
See other telephone note.  

## 2022-07-15 MED ORDER — PEG 3350-KCL-NA BICARB-NACL 420 G PO SOLR: 4000.0000 mL | Freq: Once | ORAL | 0 refills | Status: AC

## 2022-07-15 MED ORDER — FLEET ENEMA 7-19 GM/118ML RE ENEM: 1.0000 | ENEMA | Freq: Once | RECTAL | 0 refills | Status: AC

## 2022-07-15 MED ORDER — BISACODYL 5 MG PO TBEC: DELAYED_RELEASE_TABLET | ORAL | 0 refills | Status: DC

## 2022-07-15 NOTE — Addendum Note (Signed)
Addended by: Armstead Peaks on: 07/15/2022 09:12 AM   Modules accepted: Orders

## 2022-07-15 NOTE — Telephone Encounter (Signed)
Spoke with pt. She wanted tcs scheduled ASAP. She has been added to 5/29 with Dr. Marletta Lor. She has to arrange someone to take her so she needed a time to arrive. Her labs is within 30 days so will not need to be re done. Aware will send prep to pharmacy. Link sent to her to sign up for mychart to send instructions.

## 2022-07-15 NOTE — Telephone Encounter (Signed)
Pt was made aware and verbalized understanding. Pt is ready to move forward with scheduling.  

## 2022-07-16 NOTE — Telephone Encounter (Signed)
Pt was made aware and verbalized understanding.  

## 2022-07-16 NOTE — Telephone Encounter (Signed)
Tammy, please let patient know she needs to see her PCP ASAP if she has not, regarding her elevated BP. I don't want her to be cancelled if she shows up with significantly elevated BP. Her systolic pressure while in the ED was over 200, while in the office was in the 180s.

## 2022-07-18 ENCOUNTER — Encounter (HOSPITAL_COMMUNITY)
Admission: RE | Admit: 2022-07-18 | Discharge: 2022-07-18 | Disposition: A | Discharge status: Home / Self Care | Payer: Medicare Other | Source: Ambulatory Visit | Attending: Internal Medicine | Admitting: Internal Medicine

## 2022-07-18 NOTE — Pre-Procedure Instructions (Signed)
Attempted pre-op phonecall. Left VM for her to call us back. 

## 2022-07-23 ENCOUNTER — Ambulatory Visit (HOSPITAL_COMMUNITY): Payer: Medicare Other | Admitting: Anesthesiology

## 2022-07-23 ENCOUNTER — Encounter (HOSPITAL_COMMUNITY)
Admission: RE | Disposition: A | Discharge status: Home / Self Care | Payer: Self-pay | Source: Home / Self Care | Attending: Internal Medicine

## 2022-07-23 ENCOUNTER — Other Ambulatory Visit: Payer: Self-pay

## 2022-07-23 ENCOUNTER — Ambulatory Visit (HOSPITAL_COMMUNITY)
Admission: RE | Admit: 2022-07-23 | Discharge: 2022-07-23 | Disposition: A | Discharge status: Home / Self Care | Payer: Medicare Other | Attending: Internal Medicine | Admitting: Internal Medicine

## 2022-07-23 ENCOUNTER — Ambulatory Visit (HOSPITAL_BASED_OUTPATIENT_CLINIC_OR_DEPARTMENT_OTHER): Payer: Medicare Other | Admitting: Anesthesiology

## 2022-07-23 ENCOUNTER — Encounter (HOSPITAL_COMMUNITY): Payer: Self-pay

## 2022-07-23 DIAGNOSIS — K5090 Crohn's disease, unspecified, without complications: Secondary | ICD-10-CM | POA: Diagnosis present

## 2022-07-23 DIAGNOSIS — K6389 Other specified diseases of intestine: Secondary | ICD-10-CM | POA: Diagnosis not present

## 2022-07-23 DIAGNOSIS — K5730 Diverticulosis of large intestine without perforation or abscess without bleeding: Secondary | ICD-10-CM | POA: Diagnosis not present

## 2022-07-23 DIAGNOSIS — K449 Diaphragmatic hernia without obstruction or gangrene: Secondary | ICD-10-CM | POA: Insufficient documentation

## 2022-07-23 DIAGNOSIS — E039 Hypothyroidism, unspecified: Secondary | ICD-10-CM | POA: Diagnosis not present

## 2022-07-23 DIAGNOSIS — Z9889 Other specified postprocedural states: Secondary | ICD-10-CM | POA: Diagnosis not present

## 2022-07-23 DIAGNOSIS — K529 Noninfective gastroenteritis and colitis, unspecified: Secondary | ICD-10-CM

## 2022-07-23 DIAGNOSIS — K648 Other hemorrhoids: Secondary | ICD-10-CM | POA: Insufficient documentation

## 2022-07-23 DIAGNOSIS — I10 Essential (primary) hypertension: Secondary | ICD-10-CM | POA: Diagnosis not present

## 2022-07-23 DIAGNOSIS — Q2730 Arteriovenous malformation, site unspecified: Secondary | ICD-10-CM | POA: Diagnosis not present

## 2022-07-23 DIAGNOSIS — K5520 Angiodysplasia of colon without hemorrhage: Secondary | ICD-10-CM | POA: Insufficient documentation

## 2022-07-23 HISTORY — PX: COLONOSCOPY WITH PROPOFOL: SHX5780

## 2022-07-23 HISTORY — PX: BIOPSY: SHX5522

## 2022-07-23 SURGERY — COLONOSCOPY WITH PROPOFOL
Anesthesia: General

## 2022-07-23 MED ORDER — STERILE WATER FOR IRRIGATION IR SOLN
Status: DC | PRN
  Administered 2022-07-23: 120 mL

## 2022-07-23 MED ORDER — PROPOFOL 500 MG/50ML IV EMUL
INTRAVENOUS | Status: DC | PRN
  Administered 2022-07-23: 150 ug/kg/min via INTRAVENOUS

## 2022-07-23 MED ORDER — PROPOFOL 500 MG/50ML IV EMUL
INTRAVENOUS | Status: AC
  Filled 2022-07-23: qty 50

## 2022-07-23 MED ORDER — LACTATED RINGERS IV SOLN: INTRAVENOUS | Status: DC

## 2022-07-23 MED ORDER — PROPOFOL 10 MG/ML IV BOLUS
INTRAVENOUS | Status: DC | PRN
  Administered 2022-07-23: 80 mg via INTRAVENOUS

## 2022-07-23 NOTE — Discharge Instructions (Addendum)
  Colonoscopy Discharge Instructions  Read the instructions outlined below and refer to this sheet in the next few weeks. These discharge instructions provide you with general information on caring for yourself after you leave the hospital. Your doctor may also give you specific instructions. While your treatment has been planned according to the most current medical practices available, unavoidable complications occasionally occur.   ACTIVITY You may resume your regular activity, but move at a slower pace for the next 24 hours.  Take frequent rest periods for the next 24 hours.  Walking will help get rid of the air and reduce the bloated feeling in your belly (abdomen).  No driving for 24 hours (because of the medicine (anesthesia) used during the test).   Do not sign any important legal documents or operate any machinery for 24 hours (because of the anesthesia used during the test).  NUTRITION Drink plenty of fluids.  You may resume your normal diet as instructed by your doctor.  Begin with a light meal and progress to your normal diet. Heavy or fried foods are harder to digest and may make you feel sick to your stomach (nauseated).  Avoid alcoholic beverages for 24 hours or as instructed.  MEDICATIONS You may resume your normal medications unless your doctor tells you otherwise.  WHAT YOU CAN EXPECT TODAY Some feelings of bloating in the abdomen.  Passage of more gas than usual.  Spotting of blood in your stool or on the toilet paper.  IF YOU HAD POLYPS REMOVED DURING THE COLONOSCOPY: No aspirin products for 7 days or as instructed.  No alcohol for 7 days or as instructed.  Eat a soft diet for the next 24 hours.  FINDING OUT THE RESULTS OF YOUR TEST Not all test results are available during your visit. If your test results are not back during the visit, make an appointment with your caregiver to find out the results. Do not assume everything is normal if you have not heard from your  caregiver or the medical facility. It is important for you to follow up on all of your test results.  SEEK IMMEDIATE MEDICAL ATTENTION IF: You have more than a spotting of blood in your stool.  Your belly is swollen (abdominal distention).  You are nauseated or vomiting.  You have a temperature over 101.  You have abdominal pain or discomfort that is severe or gets worse throughout the day.   I did not find any polyps or evidence of colon cancer on your colonoscopy today.  The mucosa of the right side your colon did look mildly congested.  I took biopsies of your entire colon as well as the end portion of your small bowel.  Await pathology results, office will contact you.  Repeat colonoscopy 5 years.  Follow-up in GI office in 6 weeks.  I hope you have a great rest of your week!  Hennie Duos. Marletta Lor, D.O. Gastroenterology and Hepatology [facility]

## 2022-07-23 NOTE — Anesthesia Preprocedure Evaluation (Signed)
Anesthesia Evaluation  Patient identified by MRN, date of birth, ID band Patient awake    Reviewed: Allergy & Precautions, H&P , NPO status , Patient's Chart, lab work & pertinent test results, reviewed documented beta blocker date and time   Airway Mallampati: II  TM Distance: >3 FB Neck ROM: full    Dental no notable dental hx.    Pulmonary neg pulmonary ROS   Pulmonary exam normal breath sounds clear to auscultation       Cardiovascular Exercise Tolerance: Good hypertension, negative cardio ROS  Rhythm:regular Rate:Normal     Neuro/Psych  Neuromuscular disease negative neurological ROS  negative psych ROS   GI/Hepatic negative GI ROS, Neg liver ROS,,,  Endo/Other  negative endocrine ROSHypothyroidism    Renal/GU negative Renal ROS  negative genitourinary   Musculoskeletal   Abdominal   Peds  Hematology negative hematology ROS (+)   Anesthesia Other Findings   Reproductive/Obstetrics negative OB ROS                             Anesthesia Physical Anesthesia Plan  ASA: 2  Anesthesia Plan: General   Post-op Pain Management:    Induction:   PONV Risk Score and Plan: Propofol infusion  Airway Management Planned:   Additional Equipment:   Intra-op Plan:   Post-operative Plan:   Informed Consent: I have reviewed the patients History and Physical, chart, labs and discussed the procedure including the risks, benefits and alternatives for the proposed anesthesia with the patient or authorized representative who has indicated his/her understanding and acceptance.     Dental Advisory Given  Plan Discussed with: CRNA  Anesthesia Plan Comments:        Anesthesia Quick Evaluation  

## 2022-07-23 NOTE — Transfer of Care (Signed)
Immediate Anesthesia Transfer of Care Note  Patient: Bonnie Reynolds  Procedure(s) Performed: COLONOSCOPY WITH PROPOFOL  Patient Location: Short Stay  Anesthesia Type:General  Level of Consciousness: awake  Airway & Oxygen Therapy: Patient Spontanous Breathing  Post-op Assessment: Report given to RN and Post -op Vital signs reviewed and stable  Post vital signs: Reviewed and stable  Last Vitals:  Vitals Value Taken Time  BP 125/68 07/23/22 1201  Temp 36.4 C 07/23/22 1201  Pulse 79 07/23/22 1201  Resp 16 07/23/22 1201  SpO2 99 % 07/23/22 1201    Last Pain:  Vitals:   07/23/22 1201  TempSrc: Oral  PainSc: 0-No pain      Patients Stated Pain Goal: 9 (07/23/22 1027)  Complications: No notable events documented.

## 2022-07-23 NOTE — Op Note (Signed)
[facility] Patient Name: Bonnie Reynolds Procedure Date: 07/23/2022 11:23 AM MRN: [id number] Date of Birth: 1945-11-03 Attending MD: Hennie Duos. Marletta Lor , [place], [id number] CSN: [id number] Age: 77 Admit Type: Outpatient Procedure:                Colonoscopy Indications:              Abnormal CT of the GI tract, Follow-up of colitis Providers:                Hennie Duos. Marletta Lor, DO, Buel Ream. Thomasena Edis RN, RN,                            Dyann Ruddle Referring MD:              Medicines:                See the Anesthesia note for documentation of the                            administered medications Complications:            No immediate complications. Estimated Blood Loss:     Estimated blood loss was minimal. Procedure:                Pre-Anesthesia Assessment:                           - The anesthesia plan was to use monitored                            anesthesia care (MAC).                           After obtaining informed consent, the colonoscope                            was passed under direct vision. Throughout the                            procedure, the patient's blood pressure, pulse, and                            oxygen saturations were monitored continuously. The                            [id number]-HQ190L ([id number]) scope was introduced through                            the anus and advanced to the the terminal ileum,                            with identification of the appendiceal orifice and                            IC valve. The colonoscopy was performed without  difficulty. The patient tolerated the procedure                            well. The quality of the bowel preparation was                            evaluated using the BBPS [facility] Bowel Preparation                            Scale) with scores of: Right Colon = 2 (minor                            amount of residual staining, small fragments of                            stool and/or  opaque liquid, but mucosa seen well),                            Transverse Colon = 3 (entire mucosa seen well with                            no residual staining, small fragments of stool or                            opaque liquid) and Left Colon = 3 (entire mucosa                            seen well with no residual staining, small                            fragments of stool or opaque liquid). The total                            BBPS score equals 8. The quality of the bowel                            preparation was good. Scope In: 11:41:08 AM Scope Out: 11:57:28 AM Scope Withdrawal Time: 0 hours 12 minutes 35 seconds  Total Procedure Duration: 0 hours 16 minutes 20 seconds  Findings:      Non-bleeding internal hemorrhoids were found during endoscopy.      Multiple medium-mouthed and small-mouthed diverticula were found in the       sigmoid colon.      The terminal ileum appeared normal. Biopsies were taken with a cold       forceps for histology.      An area of mildly congested mucosa was found in the ascending colon and       in the cecum. Biopsies were taken with a cold forceps for histology.      Biopsies were taken with a cold forceps in the entire colon for       histology.      Multiple small and medium-sized angiodysplastic lesions without bleeding       were found in the descending colon, in the transverse colon,  in the       ascending colon and in the cecum. Impression:               - Non-bleeding internal hemorrhoids.                           - Diverticulosis in the sigmoid colon.                           - The examined portion of the ileum was normal.                            Biopsied.                           - Congested mucosa in the ascending colon and in                            the cecum. Biopsied.                           - Multiple non-bleeding colonic angiodysplastic                            lesions.                           - Biopsies were  taken with a cold forceps for                            histology in the entire colon. Moderate Sedation:      Per Anesthesia Care Recommendation:           - Patient has a contact number available for                            emergencies. The signs and symptoms of potential                            delayed complications were discussed with the                            patient. Return to normal activities tomorrow.                            Written discharge instructions were provided to the                            patient.                           - Resume previous diet.                           - Continue present medications.                           - Await pathology  results.                           - Repeat colonoscopy in 5 years for surveillance.                           - Return to GI clinic in 6 weeks. Procedure Code(s):        --- Professional ---                           [phone], Colonoscopy, flexible; with biopsy, single                            or multiple Diagnosis Code(s):        --- Professional ---                           K64.8, Other hemorrhoids                           K63.89, Other specified diseases of intestine                           K52.9, Noninfective gastroenteritis and colitis,                            unspecified                           K57.30, Diverticulosis of large intestine without                            perforation or abscess without bleeding                           R93.3, Abnormal findings on diagnostic imaging of                            other parts of digestive tract CPT copyright 2022 American Medical Association. All rights reserved. The codes documented in this report are preliminary and upon coder review may  be revised to meet current compliance requirements. Hennie Duos. Marletta Lor, DO Hennie Duos. Marletta Lor, DO 07/23/2022 12:04:21 PM This report has been signed electronically. Number of Addenda: 0

## 2022-07-23 NOTE — Interval H&P Note (Signed)
History and Physical Interval Note:  07/23/2022 11:24 AM  Bonnie Reynolds  has presented today for surgery, with the diagnosis of ABNORMAL COLON ON CT.  The various methods of treatment have been discussed with the patient and family. After consideration of risks, benefits and other options for treatment, the patient has consented to  Procedure(s) with comments: COLONOSCOPY WITH PROPOFOL (N/A) - 11:30am, asa 2 as a surgical intervention.  The patient's history has been reviewed, patient examined, no change in status, stable for surgery.  I have reviewed the patient's chart and labs.  Questions were answered to the patient's satisfaction.     Lanelle Bal

## 2022-07-24 LAB — SURGICAL PATHOLOGY

## 2022-07-29 ENCOUNTER — Telehealth: Payer: Self-pay

## 2022-07-29 ENCOUNTER — Encounter (HOSPITAL_COMMUNITY): Payer: Self-pay | Admitting: Internal Medicine

## 2022-07-29 NOTE — Telephone Encounter (Signed)
Schedule her to see Dr. Marletta Lor. In meantime I will discuss with him about potential use of budesonide for her dairrhea.   Please find out if she went back for the last liver labs that [facility] in [place] missed, the IgG/IgM/IgA, AMA?

## 2022-07-29 NOTE — Telephone Encounter (Signed)
Pt was given results to biopsies being normal and pt is wanting to know what the next steps are. 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 Please advise.

## 2022-07-30 NOTE — Telephone Encounter (Signed)
Pt states that she is not having any issues with her bowels. Pt states that she is having abdominal pain, low back pain and nausea. Lab orders were faxed over to [facility] in [place], pt stated that she will go today to have completed.   Mandy: Can you get pt scheduled with Dr. Marletta Lor instead of Verlon Au. Thanks!

## 2022-07-30 NOTE — Anesthesia Postprocedure Evaluation (Signed)
Anesthesia Post Note  Patient: Bonnie Reynolds  Procedure(s) Performed: COLONOSCOPY WITH PROPOFOL BIOPSY  Patient location during evaluation: Phase II Anesthesia Type: General Level of consciousness: awake Pain management: pain level controlled Vital Signs Assessment: post-procedure vital signs reviewed and stable Respiratory status: spontaneous breathing and respiratory function stable Cardiovascular status: blood pressure returned to baseline and stable Postop Assessment: no headache and no apparent nausea or vomiting Anesthetic complications: no Comments: Late entry   No notable events documented.   Last Vitals:  Vitals:   07/23/22 1027 07/23/22 1201  BP: (!) 163/81 125/68  Pulse: 90 79  Resp: 19 16  Temp: 36.4 C 36.4 C  SpO2: 100% 99%    Last Pain:  Vitals:   07/23/22 1201  TempSrc: Oral  PainSc: 0-No pain                 Windell Norfolk

## 2022-07-30 NOTE — Telephone Encounter (Signed)
Lmom for pt to return my call.  

## 2022-07-31 ENCOUNTER — Other Ambulatory Visit: Payer: Self-pay

## 2022-07-31 DIAGNOSIS — R7989 Other specified abnormal findings of blood chemistry: Secondary | ICD-10-CM

## 2022-07-31 NOTE — Telephone Encounter (Addendum)
Received labs from [facility]: IgA 186 IgG 808 IgM 201 AMA pending.  Labs from 07/08/22: T bili 0.9, AP 218, AST 71, ALT 63. Ferritin 18.2, viral markers negative.   Await pending labs.   Keep OV with Dr. Marletta Lor. If symptoms worsen, she may need to be sooner but PLEASE DON'T CANCEL  appt with Dr. Marletta Lor.  Tammy, can we see if you can add on LFTs to the labs at [facility] that were done 07/30/22

## 2022-07-31 NOTE — Telephone Encounter (Signed)
Spoke with Misty Stanley at [facility] and she stated that it shouldn't be a problem. Lab order was faxed over.

## 2022-07-31 NOTE — Progress Notes (Signed)
Reviewed her CT A/P with IR, Dr. Grace Isaac. He looked at her study and compared to CTA in 2016. Stable focal ectasia mid splenic artery. No follow up needed.

## 2022-08-18 NOTE — Telephone Encounter (Signed)
AMA negative.  LFTs on July 30, 2022 with total bilirubin 0.5, alk phos 180, AST 68, ALT 66.  Please let pt know Bonnie Reynolds last LFTs still elevated on 6/5 but stable.  She needs to keep appt with Dr. Marletta Lor ONLY for further input for GI symptoms and newly elevated LFTs.

## 2022-08-18 NOTE — Telephone Encounter (Signed)
Pt was made aware and verbalized understanding.  

## 2022-09-08 ENCOUNTER — Ambulatory Visit: Payer: Medicare Other | Admitting: Gastroenterology

## 2022-09-11 ENCOUNTER — Ambulatory Visit (INDEPENDENT_AMBULATORY_CARE_PROVIDER_SITE_OTHER): Payer: Medicare Other | Admitting: Internal Medicine

## 2022-09-11 ENCOUNTER — Encounter: Payer: Self-pay | Admitting: Internal Medicine

## 2022-09-11 VITALS — BP 122/80 | HR 69 | Temp 97.9°F | Ht 63.0 in | Wt 181.6 lb

## 2022-09-11 DIAGNOSIS — K529 Noninfective gastroenteritis and colitis, unspecified: Secondary | ICD-10-CM

## 2022-09-11 DIAGNOSIS — K219 Gastro-esophageal reflux disease without esophagitis: Secondary | ICD-10-CM

## 2022-09-11 DIAGNOSIS — R1033 Periumbilical pain: Secondary | ICD-10-CM

## 2022-09-11 DIAGNOSIS — K5900 Constipation, unspecified: Secondary | ICD-10-CM | POA: Diagnosis not present

## 2022-09-11 MED ORDER — PANTOPRAZOLE SODIUM 40 MG PO TBEC: 40.0000 mg | DELAYED_RELEASE_TABLET | Freq: Two times a day (BID) | ORAL | 11 refills | Status: DC

## 2022-09-11 NOTE — Progress Notes (Signed)
Referring Provider: Charlette Caffey Primary Care Physician:  Terance Hart, PA-C Primary GI:  Dr. Marletta Lor  Chief Complaint  Patient presents with   Follow-up    Follow up on LFT's     HPI:   Bonnie Reynolds is a 77 y.o. female who presents to the clinic today for follow-up visit.  History of possible Crohn's disease based on numerous ulcerations in the small bowel seen on capsule endoscopy.  Referred to force in 2017. At the time she was seen, she was having less diarrhea and the work up seemed to be more focused on upper GI issues. She has GES which was normal. She had negative H.pylori stool antigen. Patient did not go back for follow up.  She had a [facility] diagnostic labs completed here locally with results of "consistent with ulcerative colitis", which would not explain the small bowel ulcer seen on capsule study and normal colonoscopy.  Patient was lost to follow up to [place] as well. She also has a history of ischemic colitis 2015, based on flex sig done at outside facility. 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   CT abdomen pelvis with contrast 07/03/22: Generalized thickening of the descending and sigmoid colon wall suggesting acute colitis, may be infectious versus inflammatory. Colonic diverticulosis.   Colonoscopy 07/23/2022 with internal hemorrhoids, sigmoid diverticulosis, normal terminal ileum, mildly congested mucosa in the ascending colon and cecum, biopsies of her entire colon and terminal ileum unremarkable.  Also has a history of abnormal LFTs. LFTs on July 30, 2022 with total bilirubin 0.5, alk phos 180, AST 68, ALT 66.  Serological workup unremarkable.  CT abdomen pelvis with contrast 07/03/2022 unremarkable from a hepatobiliary standpoint.  Today, main complaint for me today is worsening acid reflux.  Symptoms moderate, constant, also with associated nausea.  Also describes periumbilical  abdominal pain.  Intermittent, not associated with meals.  Mild to moderate in severity.  Also complaining of constipation, mild, occasional hard stools.  Some straining.  No melena hematochezia.    Past Medical History:  Diagnosis Date   Fibromyalgia    Hypertension    Hypothyroidism    Neuropathy    Neuropathy 02/25/2012    Past Surgical History:  Procedure Laterality Date   ABDOMINAL HYSTERECTOMY     cancer   APPENDECTOMY     BACK SURGERY     X 2, cervical and thoracic   BIOPSY  09/26/2014   Procedure: BIOPSY;  Surgeon: West Bali, MD;  Location: [facility];  Service: Endoscopy;;  random colon, duodenal    BIOPSY  07/23/2022   Procedure: BIOPSY;  Surgeon: Lanelle Bal, DO;  Location: [facility];  Service: Endoscopy;;   CHOLECYSTECTOMY     COLONOSCOPY  08/2006   Dr. Gala Romney Shiflett: Normal colon except for diverticulosis and 2 rectal polyps, one was tubular adenoma.   COLONOSCOPY  2004   Dr. Riley Lam Shiflett: unremarkable   COLONOSCOPY WITH PROPOFOL N/A 09/26/2014   Procedure: COLONOSCOPY WITH PROPOFOL;  Surgeon: West Bali, MD;  Location: [facility];  Service: Endoscopy;  Laterality: N/A;  cecum time in=1017  time out=1028  total withdrawal time=61minutes procedure 1   COLONOSCOPY WITH PROPOFOL N/A 07/23/2022   Procedure: COLONOSCOPY WITH PROPOFOL;  Surgeon: Lanelle Bal, DO;  Location: [facility];  Service: Endoscopy;  Laterality: N/A;  11:30am, asa 2   ENTEROSCOPY N/A 11/07/2014  Procedure: ENTEROSCOPY WITH PROPOFOL;  Surgeon: West Bali, MD;  Location: [facility];  Service: Endoscopy;  Laterality: N/A;  with PROPOFOL 730   ESOPHAGOGASTRODUODENOSCOPY  02/2013   Dr. Samuella Cota: small hiatal hernia and mild gastritis. Negative for H pylori.   ESOPHAGOGASTRODUODENOSCOPY (EGD) WITH PROPOFOL N/A 09/26/2014   Procedure: ESOPHAGOGASTRODUODENOSCOPY (EGD) WITH PROPOFOL;  Surgeon: West Bali, MD;  Location: [facility];  Service: Endoscopy;  Laterality: N/A;   FLEXIBLE  SIGMOIDOSCOPY  04/2013   Dr. Samuella Cota, Propofol: exam to 60cm, abnormal mucosa with edema, erythema and ulceration proximal to 30 cm consistent with ischemic bowel disease. Biopsies taken, we didn't ever receive those records however. Sigmoid diverticulosis also seen.   GIVENS CAPSULE STUDY N/A 10/09/2014   Procedure: GIVENS CAPSULE STUDY;  Surgeon: West Bali, MD;  Location: [facility];  Service: Endoscopy;  Laterality: N/A;  0700   TONSILLECTOMY      Current Outpatient Medications  Medication Sig Dispense Refill   acetaminophen (TYLENOL) 500 MG tablet Take 500-1,000 mg by mouth every 6 (six) hours as needed (pain.).     hydrochlorothiazide (HYDRODIURIL) 25 MG tablet Take 25 mg by mouth in the morning.     levothyroxine (SYNTHROID) 137 MCG tablet Take 137 mcg by mouth daily before breakfast.     ondansetron (ZOFRAN) 4 MG tablet Take 1 tablet (4 mg total) by mouth every 6 (six) hours as needed for nausea or vomiting. 12 tablet 0   DULoxetine (CYMBALTA) 60 MG capsule Take 60 mg by mouth daily. (Patient not taking: Reported on 09/11/2022)     polyethylene glycol powder (MIRALAX) 17 GM/SCOOP powder Take 238 g by mouth once. (Patient not taking: Reported on 09/11/2022)     rosuvastatin (CRESTOR) 20 MG tablet Take 20 mg by mouth daily. (Patient not taking: Reported on 09/11/2022)     No current facility-administered medications for this visit.    Allergies as of 09/11/2022 - Review Complete 09/11/2022  Allergen Reaction Noted   Aspirin Nausea Only and Other (See Comments) 11/23/2014   Prednisone Swelling 06/21/2014   Codeine Anxiety 06/21/2014    Family History  Problem Relation Age of Onset   Colitis Brother        1/2 brother   Colon cancer Neg Hx    Colon polyps Neg Hx    Neuropathy Mother        deceased age 31   CVA Father        deceased age 65    Social History   Socioeconomic History   Marital status: Married    Spouse name: Not on file   Number of children: 2   Years  of education: Not on file   Highest education level: Not on file  Occupational History   Occupation: Producer, television/film/video  Tobacco Use   Smoking status: Never   Smokeless tobacco: Not on file  Vaping Use   Vaping status: Never Used  Substance and Sexual Activity   Alcohol use: No    Alcohol/week: 0.0 standard drinks of alcohol   Drug use: No   Sexual activity: Not Currently  Other Topics Concern   Not on file  Social History Narrative   TWO GRANDKIDS-AGE 41(TWINS) LIVE WITH HER. DAUGHTER WORKING AS A PHLEBOTOMIST.    Social Determinants of Health   Financial Resource Strain: Not on file  Food Insecurity: Not on file  Transportation Needs: Not on file  Physical Activity: Not on file  Stress: Not on file  Social Connections: Not on  file    Subjective: Review of Systems  Constitutional:  Negative for chills and fever.  HENT:  Negative for congestion and hearing loss.   Eyes:  Negative for blurred vision and double vision.  Respiratory:  Negative for cough and shortness of breath.   Cardiovascular:  Negative for chest pain and palpitations.  Gastrointestinal:  Positive for constipation and heartburn. Negative for abdominal pain, blood in stool, diarrhea, melena and vomiting.  Genitourinary:  Negative for dysuria and urgency.  Musculoskeletal:  Negative for joint pain and myalgias.  Skin:  Negative for itching and rash.  Neurological:  Negative for dizziness and headaches.  Psychiatric/Behavioral:  Negative for depression. The patient is not nervous/anxious.      Objective: BP 122/80   Pulse 69   Temp 97.9 F (36.6 C)   Ht 5\' 3"  (1.6 m)   Wt 181 lb 9.6 oz (82.4 kg)   BMI 32.17 kg/m  Physical Exam Constitutional:      Appearance: Normal appearance.  HENT:     Head: Normocephalic and atraumatic.  Eyes:     Extraocular Movements: Extraocular movements intact.     Conjunctiva/sclera: Conjunctivae normal.  Cardiovascular:     Rate and Rhythm: Normal rate and regular  rhythm.  Pulmonary:     Effort: Pulmonary effort is normal.     Breath sounds: Normal breath sounds.  Abdominal:     General: Bowel sounds are normal.     Palpations: Abdomen is soft.  Musculoskeletal:        General: No swelling. Normal range of motion.     Cervical back: Normal range of motion and neck supple.  Skin:    General: Skin is warm and dry.     Coloration: Skin is not jaundiced.  Neurological:     General: No focal deficit present.     Mental Status: She is alert and oriented to person, place, and time.  Psychiatric:        Mood and Affect: Mood normal.        Behavior: Behavior normal.      Assessment/Plan:  1.  Chronic GERD/epigastric pain/nausea-will start on pantoprazole 40 mg twice daily. Will schedule for EGD to evaluate for peptic ulcer disease, esophagitis, gastritis, H. Pylori, duodenitis, or other. Will also evaluate for esophageal stricture, Schatzki's ring, esophageal web or other.   The risks including infection, bleed, or perforation as well as benefits, limitations, alternatives and imponderables have been reviewed with the patient. Potential for esophageal dilation, biopsy, etc. have also been reviewed.  Questions have been answered. All parties agreeable.  2.  Chronic constipation-I recommended taking MiraLAX 1 capful daily.  If this is not adequate then increase to 2 capfuls daily.  If this is still not adequate then add on once daily Dulcolax.  Also recommended patient start taking fiber therapy.  Encouraged to drink at least 6 glasses of water daily.  3.  Abnormal LFTs-recheck in 3 months.  If still elevated, will pursue MRI/MRCP of her liver.  Follow-up after upper endoscopy.   09/11/2022 3:28 PM   Disclaimer: This note was dictated with voice recognition software. Similar sounding words can inadvertently be transcribed and may not be corrected upon review.

## 2022-09-11 NOTE — Patient Instructions (Signed)
I am going to start you on a new medication for your acid reflux and nausea called pantoprazole 40 mg twice daily.  We will schedule you for upper endoscopy to further evaluate your symptoms.  For your constipation, I want you to start taking over the counter MiraLAX 1 capful daily.  If this does not adequately control your constipation, I would increase to 2 capfuls daily.  If this is still not adequate, then I would add on once daily Dulcolax (bisacodyl) tablet.   I also recommend increasing fiber in your diet or adding OTC Benefiber. Be sure to drink at least 4 to 6 glasses of water daily.   We will recheck your liver tests in a few months, if still elevated we might need to perform further workup with MRI of your liver.  It was very nice seeing you again today.  Dr. Marletta Lor

## 2022-09-15 ENCOUNTER — Telehealth: Payer: Self-pay | Admitting: *Deleted

## 2022-09-15 NOTE — Telephone Encounter (Signed)
[facility]  EGD w/Dr.Carver, asa 3

## 2022-09-19 ENCOUNTER — Encounter: Payer: Self-pay | Admitting: *Deleted

## 2022-09-22 ENCOUNTER — Encounter: Payer: Self-pay | Admitting: *Deleted

## 2022-10-08 NOTE — Patient Instructions (Signed)
Bonnie Reynolds  10/08/2022     @PREFPERIOPPHARMACY @   Your procedure is scheduled on  10/13/2022.   Report to [facility] at  1230  P.M.   Call this number if you have problems the morning of surgery:  [phone]  If you experience any cold or flu symptoms such as cough, fever, chills, shortness of breath, etc. between now and your scheduled surgery, please notify us at the above number.   Remember:  Follow the diet instructions given to you by the office.     Take these medicines the morning of surgery with A SIP OF WATER                        levothyroxine, zofran, pantoprazole.     Do not wear jewelry, make-up or nail polish, including gel polish,  artificial nails, or any other type of covering on natural nails (fingers and  toes).  Do not wear lotions, powders, or perfumes, or deodorant.  Do not shave 48 hours prior to surgery.  Men may shave face and neck.  Do not bring valuables to the hospital.  [facility] is not responsible for any belongings or valuables.  Contacts, dentures or bridgework may not be worn into surgery.  Leave your suitcase in the car.  After surgery it may be brought to your room.  For patients admitted to the hospital, discharge time will be determined by your treatment team.  Patients discharged the day of surgery will not be allowed to drive home and must have someone with them for 24 hours.    Special instructions:   DO NOT smoke tobacco or vape for 24 hours before your procedure.  Please read over the following fact sheets that you were given. Anesthesia Post-op Instructions and Care and Recovery After Surgery      Upper Endoscopy, Adult, Care After After the procedure, it is common to have a sore throat. It is also common to have: Mild stomach pain or discomfort. Bloating. Nausea. Follow these instructions at home: The instructions below may help you care for yourself at home. Your health care provider may give you more  instructions. If you have questions, ask your health care provider. If you were given a sedative during the procedure, it can affect you for several hours. Do not drive or operate machinery until your health care provider says that it is safe. If you will be going home right after the procedure, plan to have a responsible adult: Take you home from the hospital or clinic. You will not be allowed to drive. Care for you for the time you are told. Follow instructions from your health care provider about what you may eat and drink. Return to your normal activities as told by your health care provider. Ask your health care provider what activities are safe for you. Take over-the-counter and prescription medicines only as told by your health care provider. Contact a health care provider if you: Have a sore throat that lasts longer than one day. Have trouble swallowing. Have a fever. Get help right away if you: Vomit blood or your vomit looks like coffee grounds. Have bloody, black, or tarry stools. Have a very bad sore throat or you cannot swallow. Have difficulty breathing or very bad pain in your chest or abdomen. These symptoms may be an emergency. Get help right away. Call 911. Do not wait to see if the symptoms will go away.  Do not drive yourself to the hospital. Summary After the procedure, it is common to have a sore throat, mild stomach discomfort, bloating, and nausea. If you were given a sedative during the procedure, it can affect you for several hours. Do not drive until your health care provider says that it is safe. Follow instructions from your health care provider about what you may eat and drink. Return to your normal activities as told by your health care provider. This information is not intended to replace advice given to you by your health care provider. Make sure you discuss any questions you have with your health care provider. Document Revised: 05/22/2021 Document Reviewed:  05/22/2021 Elsevier Patient Education  2024 [employer]vier [employer]. Monitored Anesthesia Care, Care After The following information offers guidance on how to care for yourself after your procedure. Your health care provider may also give you more specific instructions. If you have problems or questions, contact your health care provider. What can I expect after the procedure? After the procedure, it is common to have: Tiredness. Little or no memory about what happened during or after the procedure. Impaired judgment when it comes to making decisions. Nausea or vomiting. Some trouble with balance. Follow these instructions at home: For the time period you were told by your health care provider:  Rest. Do not participate in activities where you could fall or become injured. Do not drive or use machinery. Do not drink alcohol. Do not take sleeping pills or medicines that cause drowsiness. Do not make important decisions or sign legal documents. Do not take care of children on your own. Medicines Take over-the-counter and prescription medicines only as told by your health care provider. If you were prescribed antibiotics, take them as told by your health care provider. Do not stop using the antibiotic even if you start to feel better. Eating and drinking Follow instructions from your health care provider about what you may eat and drink. Drink enough fluid to keep your urine pale yellow. If you vomit: Drink clear fluids slowly and in small amounts as you are able. Clear fluids include water, ice chips, low-calorie sports drinks, and fruit juice that has water added to it (diluted fruit juice). Eat light and bland foods in small amounts as you are able. These foods include bananas, applesauce, rice, lean meats, toast, and crackers. General instructions  Have a responsible adult stay with you for the time you are told. It is important to have someone help care for you until you are awake and  alert. If you have sleep apnea, surgery and some medicines can increase your risk for breathing problems. Follow instructions from your health care provider about wearing your sleep device: When you are sleeping. This includes during daytime naps. While taking prescription pain medicines, sleeping medicines, or medicines that make you drowsy. Do not use any products that contain nicotine or tobacco. These products include cigarettes, chewing tobacco, and vaping devices, such as e-cigarettes. If you need help quitting, ask your health care provider. Contact a health care provider if: You feel nauseous or vomit every time you eat or drink. You feel light-headed. You are still sleepy or having trouble with balance after 24 hours. You get a rash. You have a fever. You have redness or swelling around the IV site. Get help right away if: You have trouble breathing. You have new confusion after you get home. These symptoms may be an emergency. Get help right away. Call 911. Do not wait to see  if the symptoms will go away. Do not drive yourself to the hospital. This information is not intended to replace advice given to you by your health care provider. Make sure you discuss any questions you have with your health care provider. Document Revised: 07/08/2021 Document Reviewed: 07/08/2021 Elsevier Patient Education  2024 [employer].

## 2022-10-09 ENCOUNTER — Encounter (HOSPITAL_COMMUNITY)
Admission: RE | Admit: 2022-10-09 | Discharge: 2022-10-09 | Disposition: A | Discharge status: Home / Self Care | Payer: Medicare Other | Source: Ambulatory Visit | Attending: Internal Medicine | Admitting: Internal Medicine

## 2022-10-09 ENCOUNTER — Encounter (HOSPITAL_COMMUNITY): Payer: Self-pay

## 2022-10-09 VITALS — BP 125/65 | HR 85 | Temp 97.8°F | Resp 18 | Ht 63.0 in | Wt 181.7 lb

## 2022-10-09 DIAGNOSIS — I10 Essential (primary) hypertension: Secondary | ICD-10-CM

## 2022-10-09 DIAGNOSIS — Z01818 Encounter for other preprocedural examination: Secondary | ICD-10-CM | POA: Insufficient documentation

## 2022-10-09 DIAGNOSIS — Z79899 Other long term (current) drug therapy: Secondary | ICD-10-CM

## 2022-10-09 LAB — BASIC METABOLIC PANEL
Anion gap: 11 (ref 5–15)
BUN: 19 mg/dL (ref 8–23)
CO2: 25 mmol/L (ref 22–32)
Calcium: 9 mg/dL (ref 8.9–10.3)
Chloride: 99 mmol/L (ref 98–111)
Creatinine, Ser: 0.99 mg/dL (ref 0.44–1.00)
GFR, Estimated: 58 mL/min — ABNORMAL LOW (ref >60)
Glucose, Bld: 93 mg/dL (ref 70–99)
Potassium: 3.9 mmol/L (ref 3.5–5.1)
Sodium: 135 mmol/L (ref 135–145)

## 2022-10-10 ENCOUNTER — Other Ambulatory Visit (HOSPITAL_COMMUNITY): Payer: Medicare Other

## 2022-10-13 ENCOUNTER — Ambulatory Visit (HOSPITAL_COMMUNITY): Payer: Medicare Other | Admitting: Anesthesiology

## 2022-10-13 ENCOUNTER — Ambulatory Visit (HOSPITAL_BASED_OUTPATIENT_CLINIC_OR_DEPARTMENT_OTHER): Payer: Medicare Other | Admitting: Anesthesiology

## 2022-10-13 ENCOUNTER — Encounter (HOSPITAL_COMMUNITY)
Admission: RE | Disposition: A | Discharge status: Home / Self Care | Payer: Self-pay | Source: Home / Self Care | Attending: Internal Medicine

## 2022-10-13 ENCOUNTER — Encounter (HOSPITAL_COMMUNITY): Payer: Self-pay

## 2022-10-13 ENCOUNTER — Ambulatory Visit (HOSPITAL_COMMUNITY)
Admission: RE | Admit: 2022-10-13 | Discharge: 2022-10-13 | Disposition: A | Discharge status: Home / Self Care | Payer: Medicare Other | Attending: Internal Medicine | Admitting: Internal Medicine

## 2022-10-13 DIAGNOSIS — R110 Nausea: Secondary | ICD-10-CM | POA: Diagnosis not present

## 2022-10-13 DIAGNOSIS — K449 Diaphragmatic hernia without obstruction or gangrene: Secondary | ICD-10-CM

## 2022-10-13 DIAGNOSIS — R12 Heartburn: Secondary | ICD-10-CM

## 2022-10-13 DIAGNOSIS — K219 Gastro-esophageal reflux disease without esophagitis: Secondary | ICD-10-CM | POA: Insufficient documentation

## 2022-10-13 DIAGNOSIS — E039 Hypothyroidism, unspecified: Secondary | ICD-10-CM | POA: Insufficient documentation

## 2022-10-13 DIAGNOSIS — K31A11 Gastric intestinal metaplasia without dysplasia, involving the antrum: Secondary | ICD-10-CM | POA: Diagnosis not present

## 2022-10-13 DIAGNOSIS — I10 Essential (primary) hypertension: Secondary | ICD-10-CM | POA: Insufficient documentation

## 2022-10-13 DIAGNOSIS — K2970 Gastritis, unspecified, without bleeding: Secondary | ICD-10-CM

## 2022-10-13 DIAGNOSIS — R1013 Epigastric pain: Secondary | ICD-10-CM | POA: Diagnosis present

## 2022-10-13 DIAGNOSIS — K2950 Unspecified chronic gastritis without bleeding: Secondary | ICD-10-CM | POA: Diagnosis not present

## 2022-10-13 HISTORY — PX: ESOPHAGOGASTRODUODENOSCOPY (EGD) WITH PROPOFOL: SHX5813

## 2022-10-13 HISTORY — PX: BIOPSY: SHX5522

## 2022-10-13 SURGERY — ESOPHAGOGASTRODUODENOSCOPY (EGD) WITH PROPOFOL
Anesthesia: General

## 2022-10-13 MED ORDER — LACTATED RINGERS IV SOLN: INTRAVENOUS | Status: DC | PRN

## 2022-10-13 MED ORDER — LIDOCAINE HCL (CARDIAC) PF 100 MG/5ML IV SOSY
PREFILLED_SYRINGE | INTRAVENOUS | Status: DC | PRN
  Administered 2022-10-13: 50 mg via INTRAVENOUS

## 2022-10-13 MED ORDER — PROPOFOL 10 MG/ML IV BOLUS
INTRAVENOUS | Status: DC | PRN
  Administered 2022-10-13: 30 mg via INTRAVENOUS
  Administered 2022-10-13: 50 mg via INTRAVENOUS
  Administered 2022-10-13: 120 mg via INTRAVENOUS

## 2022-10-13 NOTE — Anesthesia Preprocedure Evaluation (Signed)
Anesthesia Evaluation  Patient identified by MRN, date of birth, ID band Patient awake    Reviewed: Allergy & Precautions, H&P , NPO status , Patient's Chart, lab work & pertinent test results, reviewed documented beta blocker date and time   Airway Mallampati: II  TM Distance: >3 FB Neck ROM: full    Dental no notable dental hx.    Pulmonary neg pulmonary ROS   Pulmonary exam normal breath sounds clear to auscultation       Cardiovascular Exercise Tolerance: Good hypertension, negative cardio ROS  Rhythm:regular Rate:Normal     Neuro/Psych  Neuromuscular disease negative neurological ROS  negative psych ROS   GI/Hepatic negative GI ROS, Neg liver ROS,,,  Endo/Other  negative endocrine ROSHypothyroidism    Renal/GU negative Renal ROS  negative genitourinary   Musculoskeletal  (+)  Fibromyalgia -  Abdominal   Peds  Hematology negative hematology ROS (+)   Anesthesia Other Findings   Reproductive/Obstetrics negative OB ROS                             Anesthesia Physical Anesthesia Plan  ASA: 2  Anesthesia Plan: General   Post-op Pain Management:    Induction:   PONV Risk Score and Plan: Propofol infusion  Airway Management Planned:   Additional Equipment:   Intra-op Plan:   Post-operative Plan:   Informed Consent: I have reviewed the patients History and Physical, chart, labs and discussed the procedure including the risks, benefits and alternatives for the proposed anesthesia with the patient or authorized representative who has indicated his/her understanding and acceptance.     Dental Advisory Given  Plan Discussed with: CRNA  Anesthesia Plan Comments:        Anesthesia Quick Evaluation

## 2022-10-13 NOTE — H&P (Signed)
Primary Care Physician:  Terance Hart, PA-C Primary Gastroenterologist:  Dr. Marletta Lor  Pre-Procedure History & Physical: HPI:  Bonnie Reynolds is a 77 y.o. female is here for an EGD to be performed for Chronic GERD/epigastric pain/nausea   Past Medical History:  Diagnosis Date   Fibromyalgia    Hypertension    Hypothyroidism    Neuropathy    Neuropathy 02/25/2012    Past Surgical History:  Procedure Laterality Date   ABDOMINAL HYSTERECTOMY     cancer   APPENDECTOMY     BACK SURGERY     X 2, cervical and thoracic   BIOPSY  09/26/2014   Procedure: BIOPSY;  Surgeon: West Bali, MD;  Location: [facility];  Service: Endoscopy;;  random colon, duodenal    BIOPSY  07/23/2022   Procedure: BIOPSY;  Surgeon: Lanelle Bal, DO;  Location: [facility];  Service: Endoscopy;;   CHOLECYSTECTOMY     COLONOSCOPY  08/2006   Dr. Gala Romney Shiflett: Normal colon except for diverticulosis and 2 rectal polyps, one was tubular adenoma.   COLONOSCOPY  2004   Dr. Riley Lam Shiflett: unremarkable   COLONOSCOPY WITH PROPOFOL N/A 09/26/2014   Procedure: COLONOSCOPY WITH PROPOFOL;  Surgeon: West Bali, MD;  Location: [facility];  Service: Endoscopy;  Laterality: N/A;  cecum time in=1017  time out=1028  total withdrawal time=5minutes procedure 1   COLONOSCOPY WITH PROPOFOL N/A 07/23/2022   Procedure: COLONOSCOPY WITH PROPOFOL;  Surgeon: Lanelle Bal, DO;  Location: [facility];  Service: Endoscopy;  Laterality: N/A;  11:30am, asa 2   ENTEROSCOPY N/A 11/07/2014   Procedure: ENTEROSCOPY WITH PROPOFOL;  Surgeon: West Bali, MD;  Location: [facility];  Service: Endoscopy;  Laterality: N/A;  with PROPOFOL 730   ESOPHAGOGASTRODUODENOSCOPY  02/2013   Dr. Samuella Cota: small hiatal hernia and mild gastritis. Negative for H pylori.   ESOPHAGOGASTRODUODENOSCOPY (EGD) WITH PROPOFOL N/A 09/26/2014   Procedure: ESOPHAGOGASTRODUODENOSCOPY (EGD) WITH PROPOFOL;  Surgeon: West Bali, MD;  Location: [facility];  Service: Endoscopy;   Laterality: N/A;   FLEXIBLE SIGMOIDOSCOPY  04/2013   Dr. Samuella Cota, Propofol: exam to 60cm, abnormal mucosa with edema, erythema and ulceration proximal to 30 cm consistent with ischemic bowel disease. Biopsies taken, we didn't ever receive those records however. Sigmoid diverticulosis also seen.   GIVENS CAPSULE STUDY N/A 10/09/2014   Procedure: GIVENS CAPSULE STUDY;  Surgeon: West Bali, MD;  Location: [facility];  Service: Endoscopy;  Laterality: N/A;  0700   TONSILLECTOMY      Prior to Admission medications   Medication Sig Start Date End Date Taking? Authorizing Provider  acetaminophen (TYLENOL) 500 MG tablet Take 500-1,000 mg by mouth every 6 (six) hours as needed (pain.).   Yes [provider]  hydrochlorothiazide (HYDRODIURIL) 25 MG tablet Take 25 mg by mouth in the morning.   Yes [provider]  levothyroxine (SYNTHROID) 137 MCG tablet Take 137 mcg by mouth daily before breakfast.   Yes [provider]  ondansetron (ZOFRAN) 4 MG tablet Take 1 tablet (4 mg total) by mouth every 6 (six) hours as needed for nausea or vomiting. 07/03/22  Yes Sherian Maroon A, PA  pantoprazole (PROTONIX) 40 MG tablet Take 1 tablet (40 mg total) by mouth 2 (two) times daily. 09/11/22 09/11/23 Yes Merrily Tegeler, Hennie Duos, DO  DULoxetine (CYMBALTA) 60 MG capsule Take 60 mg by mouth daily. Patient not taking: Reported on 09/11/2022    [provider]  polyethylene glycol powder (MIRALAX) 17 GM/SCOOP  powder Take 238 g by mouth once. Patient not taking: Reported on 09/11/2022    [provider]  rosuvastatin (CRESTOR) 20 MG tablet Take 20 mg by mouth daily. Patient not taking: Reported on 09/11/2022    [provider]    Allergies as of 09/19/2022 - Review Complete 09/11/2022  Allergen Reaction Noted   Aspirin Nausea Only and Other (See Comments) 11/23/2014   Prednisone Swelling 06/21/2014   Codeine Anxiety 06/21/2014    Family History  Problem Relation Age of  Onset   Colitis Brother        1/2 brother   Colon cancer Neg Hx    Colon polyps Neg Hx    Neuropathy Mother        deceased age 41   CVA Father        deceased age 18    Social History   Socioeconomic History   Marital status: Married    Spouse name: Not on file   Number of children: 2   Years of education: Not on file   Highest education level: Not on file  Occupational History   Occupation: Producer, television/film/video  Tobacco Use   Smoking status: Never   Smokeless tobacco: Not on file  Vaping Use   Vaping status: Never Used  Substance and Sexual Activity   Alcohol use: No    Alcohol/week: 0.0 standard drinks of alcohol   Drug use: No   Sexual activity: Not Currently  Other Topics Concern   Not on file  Social History Narrative   TWO GRANDKIDS-AGE 4(TWINS) LIVE WITH HER. DAUGHTER WORKING AS A PHLEBOTOMIST.    Social Determinants of Health   Financial Resource Strain: Not on file  Food Insecurity: Not on file  Transportation Needs: Not on file  Physical Activity: Not on file  Stress: Not on file  Social Connections: Not on file  Intimate Partner Violence: Not on file    Review of Systems: General: Negative for fever, chills, fatigue, weakness. Eyes: Negative for vision changes.  ENT: Negative for hoarseness, difficulty swallowing , nasal congestion. CV: Negative for chest pain, angina, palpitations, dyspnea on exertion, peripheral edema.  Respiratory: Negative for dyspnea at rest, dyspnea on exertion, cough, sputum, wheezing.  GI: See history of present illness. GU:  Negative for dysuria, hematuria, urinary incontinence, urinary frequency, nocturnal urination.  MS: Negative for joint pain, low back pain.  Derm: Negative for rash or itching.  Neuro: Negative for weakness, abnormal sensation, seizure, frequent headaches, memory loss, confusion.  Psych: Negative for anxiety, depression Endo: Negative for unusual weight change.  Heme: Negative for bruising or  bleeding. Allergy: Negative for rash or hives.  Physical Exam: Vital signs in last 24 hours: Temp:  [97.7 F (36.5 C)] 97.7 F (36.5 C) (08/19 1229) Pulse Rate:  [83] 83 (08/19 1229) BP: (158)/(85) 158/85 (08/19 1229) SpO2:  [99 %] 99 % (08/19 1229)   General:   Alert,  Well-developed, well-nourished, pleasant and cooperative in NAD Head:  Normocephalic and atraumatic. Eyes:  Sclera clear, no icterus.   Conjunctiva pink. Ears:  Normal auditory acuity. Nose:  No deformity, discharge,  or lesions. Msk:  Symmetrical without gross deformities. Normal posture. Extremities:  Without clubbing or edema. Neurologic:  Alert and  oriented x4;  grossly normal neurologically. Skin:  Intact without significant lesions or rashes. Psych:  Alert and cooperative. Normal mood and affect.   Impression/Plan: Bonnie Reynolds is here for an EGD to be performed for Chronic GERD/epigastric pain/nausea   Risks,  benefits, limitations, imponderables and alternatives regarding procedure have been reviewed with the patient. Questions have been answered. All parties agreeable.

## 2022-10-13 NOTE — Op Note (Signed)
[facility] Patient Name: Bonnie Reynolds Procedure Date: 10/13/2022 1:59 PM MRN: [id number] Date of Birth: August 10, 1945 Attending MD: Hennie Duos. Marletta Lor , [place], [id number] CSN: [id number] Age: 77 Admit Type: Outpatient Procedure:                Upper GI endoscopy Indications:              Epigastric abdominal pain, Heartburn, Nausea Providers:                Hennie Duos. Marletta Lor, DO, Crystal Page, Durwin Glaze Tech, Technician Referring MD:              Medicines:                See the Anesthesia note for documentation of the                            administered medications Complications:            No immediate complications. Estimated Blood Loss:     Estimated blood loss was minimal. Procedure:                Pre-Anesthesia Assessment:                           - The anesthesia plan was to use monitored                            anesthesia care (MAC).                           After obtaining informed consent, the endoscope was                            passed under direct vision. Throughout the                            procedure, the patient's blood pressure, pulse, and                            oxygen saturations were monitored continuously. The                            [id number]-H190 ([id number]) scope was introduced through the                            mouth, and advanced to the second part of duodenum.                            The upper GI endoscopy was accomplished without                            difficulty. The patient tolerated the procedure                            well. Scope  In: 2:05:43 PM Scope Out: 2:08:20 PM Total Procedure Duration: 0 hours 2 minutes 37 seconds  Findings:      A small hiatal hernia was present.      Patchy mild inflammation characterized by erythema was found in the       gastric body and in the gastric antrum. Biopsies were taken with a cold       forceps for Helicobacter pylori testing.      The duodenal  bulb, first portion of the duodenum and second portion of       the duodenum were normal. Impression:               - Small hiatal hernia.                           - Gastritis. Biopsied.                           - Normal duodenal bulb, first portion of the                            duodenum and second portion of the duodenum. Moderate Sedation:      Per Anesthesia Care Recommendation:           - Patient has a contact number available for                            emergencies. The signs and symptoms of potential                            delayed complications were discussed with the                            patient. Return to normal activities tomorrow.                            Written discharge instructions were provided to the                            patient.                           - Resume previous diet.                           - Continue present medications.                           - Await pathology results.                           - Use a proton pump inhibitor PO BID.                           - Return to GI clinic in 3 months.                           - Refer to ENT for hoarseness, possible  laryngoscopy Procedure Code(s):        --- Professional ---                           [phone], Esophagogastroduodenoscopy, flexible,                            transoral; with biopsy, single or multiple Diagnosis Code(s):        --- Professional ---                           K44.9, Diaphragmatic hernia without obstruction or                            gangrene                           K29.70, Gastritis, unspecified, without bleeding                           R10.13, Epigastric pain                           R12, Heartburn                           R11.0, Nausea CPT copyright 2022 American Medical Association. All rights reserved. The codes documented in this report are preliminary and upon coder review may  be revised to meet current compliance requirements. Hennie Duos.  Marletta Lor, DO Hennie Duos. Marletta Lor, DO 10/13/2022 2:13:11 PM This report has been signed electronically. Number of Addenda: 0

## 2022-10-13 NOTE — Transfer of Care (Signed)
Immediate Anesthesia Transfer of Care Note  Patient: Bonnie Reynolds  Procedure(s) Performed: ESOPHAGOGASTRODUODENOSCOPY (EGD) WITH PROPOFOL BIOPSY  Patient Location: Short Stay  Anesthesia Type:General  Level of Consciousness: drowsy  Airway & Oxygen Therapy: Patient Spontanous Breathing  Post-op Assessment: Report given to RN and Post -op Vital signs reviewed and stable  Post vital signs: Reviewed and stable  Last Vitals:  Vitals Value Taken Time  BP    Temp    Pulse    Resp    SpO2      Last Pain:  Vitals:   10/13/22 1401  TempSrc:   PainSc: 0-No pain      Patients Stated Pain Goal: 6 (10/13/22 1229)  Complications: No notable events documented.

## 2022-10-13 NOTE — Discharge Instructions (Addendum)
EGD Discharge instructions Please read the instructions outlined below and refer to this sheet in the next few weeks. These discharge instructions provide you with general information on caring for yourself after you leave the hospital. Your doctor may also give you specific instructions. While your treatment has been planned according to the most current medical practices available, unavoidable complications occasionally occur. If you have any problems or questions after discharge, please call your doctor. ACTIVITY You may resume your regular activity but move at a slower pace for the next 24 hours.  Take frequent rest periods for the next 24 hours.  Walking will help expel (get rid of) the air and reduce the bloated feeling in your abdomen.  No driving for 24 hours (because of the anesthesia (medicine) used during the test).  You may shower.  Do not sign any important legal documents or operate any machinery for 24 hours (because of the anesthesia used during the test).  NUTRITION Drink plenty of fluids.  You may resume your normal diet.  Begin with a light meal and progress to your normal diet.  Avoid alcoholic beverages for 24 hours or as instructed by your caregiver.  MEDICATIONS You may resume your normal medications unless your caregiver tells you otherwise.  WHAT YOU CAN EXPECT TODAY You may experience abdominal discomfort such as a feeling of fullness or "gas" pains.  FOLLOW-UP Your doctor will discuss the results of your test with you.  SEEK IMMEDIATE MEDICAL ATTENTION IF ANY OF THE FOLLOWING OCCUR: Excessive nausea (feeling sick to your stomach) and/or vomiting.  Severe abdominal pain and distention (swelling).  Trouble swallowing.  Temperature over 101 F (37.8 C).  Rectal bleeding or vomiting of blood.   Your EGD revealed mild amount inflammation in your stomach.  I took biopsies of this to rule out infection with a bacteria called H. pylori.  Await pathology results, my  office will contact you.  Esophagus appeared normal.  You have a small hiatal hernia.  Small bowel appeared normal.  Continue on pantoprazole twice daily.  I will refer you to your nose and throat for possible laryngoscopy.  Follow-up with GI in 3 months.   I hope you have a great rest of your week!  Hennie Duos. Marletta Lor, D.O. Gastroenterology and Hepatology [facility]

## 2022-10-13 NOTE — Anesthesia Procedure Notes (Signed)
Date/Time: 10/13/2022 2:04 PM  Performed by: Julian Reil, CRNAPre-anesthesia Checklist: Patient identified, Emergency Drugs available, Suction available and Patient being monitored Patient Re-evaluated:Patient Re-evaluated prior to induction Oxygen Delivery Method: Nasal cannula Induction Type: IV induction Placement Confirmation: positive ETCO2

## 2022-10-15 LAB — SURGICAL PATHOLOGY

## 2022-10-16 ENCOUNTER — Encounter (HOSPITAL_COMMUNITY): Payer: Self-pay | Admitting: Internal Medicine

## 2022-10-17 NOTE — Anesthesia Postprocedure Evaluation (Signed)
Anesthesia Post Note  Patient: Bonnie Reynolds  Procedure(s) Performed: ESOPHAGOGASTRODUODENOSCOPY (EGD) WITH PROPOFOL BIOPSY  Patient location during evaluation: Phase II Anesthesia Type: General Level of consciousness: awake Pain management: pain level controlled Vital Signs Assessment: post-procedure vital signs reviewed and stable Respiratory status: spontaneous breathing and respiratory function stable Cardiovascular status: blood pressure returned to baseline and stable Postop Assessment: no headache and no apparent nausea or vomiting Anesthetic complications: no Comments: Late entry   No notable events documented.   Last Vitals:  Vitals:   10/13/22 1229 10/13/22 1413  BP: (!) 158/85 125/77  Pulse: 83 87  Resp:  17  Temp: 36.5 C 36.7 C  SpO2: 99% 97%    Last Pain:  Vitals:   10/13/22 1413  TempSrc: Oral  PainSc: 0-No pain                 Windell Norfolk

## 2022-10-21 ENCOUNTER — Other Ambulatory Visit: Payer: Self-pay | Admitting: *Deleted

## 2022-10-21 DIAGNOSIS — R490 Dysphonia: Secondary | ICD-10-CM

## 2022-12-04 ENCOUNTER — Encounter: Payer: Self-pay | Admitting: Internal Medicine

## 2022-12-08 ENCOUNTER — Other Ambulatory Visit (HOSPITAL_COMMUNITY): Payer: Self-pay | Admitting: Physician Assistant

## 2022-12-08 DIAGNOSIS — K219 Gastro-esophageal reflux disease without esophagitis: Secondary | ICD-10-CM

## 2022-12-08 DIAGNOSIS — T17308A Unspecified foreign body in larynx causing other injury, initial encounter: Secondary | ICD-10-CM

## 2022-12-15 ENCOUNTER — Telehealth (HOSPITAL_COMMUNITY): Payer: Self-pay | Admitting: Physician Assistant

## 2022-12-15 NOTE — Telephone Encounter (Signed)
Attempted to call patient to schedule OP MBS. Left VM. AH

## 2022-12-24 ENCOUNTER — Telehealth (HOSPITAL_COMMUNITY): Payer: Self-pay | Admitting: *Deleted

## 2022-12-24 NOTE — Telephone Encounter (Signed)
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 Deferred and will follow up mid November to schedule OP MBS.  AHARRIS

## 2023-02-07 ENCOUNTER — Other Ambulatory Visit: Payer: Self-pay | Admitting: Internal Medicine

## 2023-06-12 ENCOUNTER — Other Ambulatory Visit: Payer: Self-pay

## 2023-06-12 ENCOUNTER — Encounter (HOSPITAL_COMMUNITY): Payer: Self-pay

## 2023-06-12 ENCOUNTER — Emergency Department (HOSPITAL_COMMUNITY)

## 2023-06-12 ENCOUNTER — Emergency Department (HOSPITAL_COMMUNITY)
Admission: EM | Admit: 2023-06-12 | Discharge: 2023-06-12 | Disposition: A | Discharge status: Home / Self Care | Attending: Emergency Medicine | Admitting: Emergency Medicine

## 2023-06-12 DIAGNOSIS — W19XXXA Unspecified fall, initial encounter: Secondary | ICD-10-CM

## 2023-06-12 DIAGNOSIS — W1809XA Striking against other object with subsequent fall, initial encounter: Secondary | ICD-10-CM | POA: Diagnosis not present

## 2023-06-12 DIAGNOSIS — S0990XA Unspecified injury of head, initial encounter: Secondary | ICD-10-CM

## 2023-06-12 DIAGNOSIS — S00431A Contusion of right ear, initial encounter: Secondary | ICD-10-CM | POA: Insufficient documentation

## 2023-06-12 MED ORDER — ACETAMINOPHEN 500 MG PO TABS
1000.0000 mg | ORAL_TABLET | Freq: Once | ORAL | Status: AC
  Administered 2023-06-12: 1000 mg via ORAL
  Filled 2023-06-12: qty 2

## 2023-06-12 NOTE — ED Notes (Signed)
 Patient states she is ready for her results so she can go home

## 2023-06-12 NOTE — ED Notes (Signed)
 Ambulatory to restroom

## 2023-06-12 NOTE — ED Provider Notes (Signed)
 [place] EMERGENCY DEPARTMENT AT [facility]  Provider Note  CSN: [id number] Arrival date & time: 06/12/23 2122  History Chief Complaint  Patient presents with   Bonnie Reynolds is a 78 y.o. female here with grandson, reports around 7pm today she was getting ready to leave her house and turned to grab her keys when she lost balance and fell, hitting her head on a wooden chair. Denies LOC, complaining of pain behind her R ear/upper neck and across her shoulders. She also thinks she may have hit her lip.    Home Medications Prior to Admission medications   Medication Sig Start Date End Date Taking? Authorizing Provider  acetaminophen  (TYLENOL ) 500 MG tablet Take 500-1,000 mg by mouth every 6 (six) hours as needed (pain.).    [provider]  DULoxetine (CYMBALTA) 60 MG capsule Take 60 mg by mouth daily. Patient not taking: Reported on 09/11/2022    [provider]  hydrochlorothiazide (HYDRODIURIL) 25 MG tablet Take 25 mg by mouth in the morning.    [provider]  levothyroxine (SYNTHROID) 137 MCG tablet Take 137 mcg by mouth daily before breakfast.    [provider]  ondansetron  (ZOFRAN ) 4 MG tablet Take 1 tablet (4 mg total) by mouth every 6 (six) hours as needed for nausea or vomiting. 07/03/22   [place] Butter, PA  pantoprazole  (PROTONIX ) 40 MG tablet Take 1 tablet by mouth twice daily 02/10/23   Carver, Stephon Weathers K, DO  polyethylene glycol powder (MIRALAX ) 17 GM/SCOOP powder Take 238 g by mouth once. Patient not taking: Reported on 09/11/2022    [provider]  rosuvastatin (CRESTOR) 20 MG tablet Take 20 mg by mouth daily. Patient not taking: Reported on 09/11/2022    [provider]     Allergies    Aspirin, Prednisone, and Codeine   Review of Systems   Review of Systems Please see HPI for pertinent positives and negatives  Physical Exam BP (!) 181/89   Pulse 88   Temp 98.1 F (36.7 C)   Resp 16    Ht 5\' 3"  (1.6 m)   Wt 77.1 kg   SpO2 98%   BMI 30.11 kg/m   Physical Exam Vitals and nursing note reviewed.  Constitutional:      Appearance: Normal appearance.  HENT:     Head: Normocephalic.     Comments: Contusion and abrasion behind R ear    Nose: Nose normal.     Mouth/Throat:     Mouth: Mucous membranes are moist.     Comments: Mild L upper lip swelling without skin break, teeth are normal Eyes:     Extraocular Movements: Extraocular movements intact.     Conjunctiva/sclera: Conjunctivae normal.  Cardiovascular:     Rate and Rhythm: Normal rate.  Pulmonary:     Effort: Pulmonary effort is normal.     Breath sounds: Normal breath sounds.  Abdominal:     General: Abdomen is flat.     Palpations: Abdomen is soft.     Tenderness: There is no abdominal tenderness.  Musculoskeletal:        General: No swelling or signs of injury. Normal range of motion.     Cervical back: Neck supple. No tenderness (no midline tenderness).  Skin:    General: Skin is warm and dry.  Neurological:     General: No focal deficit present.     Mental Status: She is alert.  Psychiatric:  Mood and Affect: Mood normal.     ED Results / Procedures / Treatments   EKG None  Procedures Procedures  Medications Ordered in the ED Medications  acetaminophen  (TYLENOL ) tablet 1,000 mg (1,000 mg Oral Given 06/12/23 2324)    Initial Impression and Plan  Patient here after fall earlier today without LOC. She has an unremarkable neuro exam now, will check CT head and Cspine. She is requesting APAP for pain.   ED Course   Clinical Course as of 06/12/23 2333  Fri Jun 12, 2023  2331 I personally viewed the images from radiology studies and agree with radiologist interpretation: CT head and Cspine negative for acute injury. Patient's BP improved without intervention. Recommend ice pack to help with swelling. APAP for pain. Patient is otherwise ready to go home. PCP follow up, RTED for any other  concerns.   [CS]    Clinical Course User Index [CS] Charmayne Cooper, MD     MDM Rules/Calculators/A&P Medical Decision Making Problems Addressed: Fall, initial encounter: acute illness or injury Injury of head, initial encounter: acute illness or injury  Amount and/or Complexity of Data Reviewed Radiology: ordered and independent interpretation performed. Decision-making details documented in ED Course.  Risk OTC drugs.     Final Clinical Impression(s) / ED Diagnoses Final diagnoses:  Fall, initial encounter  Injury of head, initial encounter    Rx / DC Orders ED Discharge Orders     None        Charmayne Cooper, MD 06/12/23 2333

## 2023-06-12 NOTE — ED Triage Notes (Signed)
 Pt reports she picked up her keys and turned around and fell down.  Pt denies any LOC but reports neck pain, mouth pain and achy all over.

## 2023-06-12 NOTE — ED Notes (Signed)
 ED Provider at bedside.

## 2023-06-12 NOTE — ED Notes (Signed)
 Patient back from CT.

## 2024-02-25 ENCOUNTER — Encounter: Payer: Self-pay | Admitting: Gastroenterology
